# Patient Record
Sex: Female | Born: 1940 | Race: Black or African American | Hispanic: No | Marital: Married | Smoking: Former smoker
Health system: Southern US, Community
[De-identification: ages and names within clinical notes are randomized; demographics above are authoritative.]

## PROBLEM LIST (undated history)

## (undated) DIAGNOSIS — I471 Supraventricular tachycardia, unspecified: Secondary | ICD-10-CM

## (undated) DIAGNOSIS — Z87891 Personal history of nicotine dependence: Secondary | ICD-10-CM

## (undated) DIAGNOSIS — I495 Sick sinus syndrome: Secondary | ICD-10-CM

## (undated) DIAGNOSIS — G40909 Epilepsy, unspecified, not intractable, without status epilepticus: Secondary | ICD-10-CM

## (undated) HISTORY — PX: ABDOMINAL HYSTERECTOMY: SHX81

## (undated) HISTORY — DX: Supraventricular tachycardia, unspecified: I47.10

## (undated) HISTORY — DX: Sick sinus syndrome: I49.5

## (undated) HISTORY — DX: Personal history of nicotine dependence: Z87.891

## (undated) HISTORY — DX: Epilepsy, unspecified, not intractable, without status epilepticus: G40.909

## (undated) HISTORY — DX: Supraventricular tachycardia: I47.1

---

## 2000-08-12 ENCOUNTER — Inpatient Hospital Stay (HOSPITAL_COMMUNITY): Admission: EM | Admit: 2000-08-12 | Discharge: 2000-08-15 | Payer: Self-pay | Admitting: Emergency Medicine

## 2000-08-12 ENCOUNTER — Encounter: Payer: Self-pay | Admitting: Emergency Medicine

## 2000-08-12 ENCOUNTER — Encounter: Payer: Self-pay | Admitting: Internal Medicine

## 2001-04-26 ENCOUNTER — Ambulatory Visit (HOSPITAL_COMMUNITY): Admission: RE | Admit: 2001-04-26 | Discharge: 2001-04-26 | Payer: Self-pay | Admitting: Family Medicine

## 2001-04-26 ENCOUNTER — Encounter: Payer: Self-pay | Admitting: Family Medicine

## 2002-04-12 ENCOUNTER — Ambulatory Visit (HOSPITAL_COMMUNITY): Admission: RE | Admit: 2002-04-12 | Discharge: 2002-04-12 | Payer: Self-pay | Admitting: Family Medicine

## 2002-04-12 ENCOUNTER — Encounter: Payer: Self-pay | Admitting: Family Medicine

## 2003-04-24 ENCOUNTER — Other Ambulatory Visit: Admission: RE | Admit: 2003-04-24 | Discharge: 2003-04-24 | Payer: Self-pay | Admitting: General Surgery

## 2006-05-05 ENCOUNTER — Inpatient Hospital Stay (HOSPITAL_COMMUNITY): Admission: EM | Admit: 2006-05-05 | Discharge: 2006-05-06 | Payer: Self-pay | Admitting: Emergency Medicine

## 2008-06-10 ENCOUNTER — Encounter: Payer: Self-pay | Admitting: Cardiology

## 2008-06-13 ENCOUNTER — Encounter: Payer: Self-pay | Admitting: Cardiology

## 2008-10-10 ENCOUNTER — Ambulatory Visit: Payer: Self-pay | Admitting: Cardiology

## 2008-10-10 ENCOUNTER — Encounter: Payer: Self-pay | Admitting: Cardiology

## 2008-10-11 ENCOUNTER — Encounter: Payer: Self-pay | Admitting: Cardiology

## 2008-10-17 ENCOUNTER — Ambulatory Visit: Payer: Self-pay | Admitting: Cardiology

## 2008-11-07 DIAGNOSIS — E785 Hyperlipidemia, unspecified: Secondary | ICD-10-CM

## 2008-11-07 HISTORY — DX: Hyperlipidemia, unspecified: E78.5

## 2008-11-13 ENCOUNTER — Ambulatory Visit: Payer: Self-pay | Admitting: Cardiology

## 2008-11-13 DIAGNOSIS — R55 Syncope and collapse: Secondary | ICD-10-CM

## 2008-11-13 HISTORY — DX: Syncope and collapse: R55

## 2008-11-14 ENCOUNTER — Encounter: Payer: Self-pay | Admitting: Cardiology

## 2008-12-11 ENCOUNTER — Ambulatory Visit: Payer: Self-pay | Admitting: Internal Medicine

## 2008-12-20 ENCOUNTER — Encounter: Payer: Self-pay | Admitting: Internal Medicine

## 2008-12-20 ENCOUNTER — Telehealth (INDEPENDENT_AMBULATORY_CARE_PROVIDER_SITE_OTHER): Payer: Self-pay | Admitting: *Deleted

## 2008-12-27 ENCOUNTER — Ambulatory Visit (HOSPITAL_COMMUNITY): Admission: RE | Admit: 2008-12-27 | Discharge: 2008-12-27 | Payer: Self-pay | Admitting: Internal Medicine

## 2008-12-27 ENCOUNTER — Ambulatory Visit: Payer: Self-pay | Admitting: Internal Medicine

## 2008-12-31 ENCOUNTER — Encounter: Payer: Self-pay | Admitting: Internal Medicine

## 2009-01-09 ENCOUNTER — Ambulatory Visit: Payer: Self-pay

## 2009-01-29 ENCOUNTER — Ambulatory Visit (HOSPITAL_COMMUNITY): Payer: Self-pay | Admitting: Oncology

## 2009-01-29 ENCOUNTER — Encounter (HOSPITAL_COMMUNITY): Admission: RE | Admit: 2009-01-29 | Discharge: 2009-02-28 | Payer: Self-pay | Admitting: Oncology

## 2009-04-04 ENCOUNTER — Ambulatory Visit: Payer: Self-pay | Admitting: Internal Medicine

## 2009-04-04 DIAGNOSIS — R569 Unspecified convulsions: Secondary | ICD-10-CM | POA: Insufficient documentation

## 2009-04-04 HISTORY — DX: Unspecified convulsions: R56.9

## 2009-04-07 DIAGNOSIS — I495 Sick sinus syndrome: Secondary | ICD-10-CM | POA: Insufficient documentation

## 2009-04-11 ENCOUNTER — Inpatient Hospital Stay (HOSPITAL_COMMUNITY): Admission: RE | Admit: 2009-04-11 | Discharge: 2009-04-12 | Payer: Self-pay | Admitting: Internal Medicine

## 2009-04-11 ENCOUNTER — Ambulatory Visit: Payer: Self-pay | Admitting: Internal Medicine

## 2009-04-11 HISTORY — PX: PACEMAKER INSERTION: SHX728

## 2009-04-12 ENCOUNTER — Encounter: Payer: Self-pay | Admitting: Internal Medicine

## 2009-04-23 ENCOUNTER — Encounter: Payer: Self-pay | Admitting: Internal Medicine

## 2009-04-23 ENCOUNTER — Ambulatory Visit: Payer: Self-pay

## 2009-05-02 ENCOUNTER — Encounter (HOSPITAL_COMMUNITY): Admission: RE | Admit: 2009-05-02 | Discharge: 2009-06-01 | Payer: Self-pay | Admitting: Oncology

## 2009-05-02 ENCOUNTER — Ambulatory Visit (HOSPITAL_COMMUNITY): Payer: Self-pay | Admitting: Oncology

## 2009-06-09 ENCOUNTER — Encounter: Payer: Self-pay | Admitting: Internal Medicine

## 2009-08-01 ENCOUNTER — Encounter (HOSPITAL_COMMUNITY): Admission: RE | Admit: 2009-08-01 | Discharge: 2009-08-31 | Payer: Self-pay | Admitting: Oncology

## 2009-08-04 ENCOUNTER — Ambulatory Visit (HOSPITAL_COMMUNITY): Payer: Self-pay | Admitting: Oncology

## 2009-08-08 ENCOUNTER — Ambulatory Visit: Payer: Self-pay | Admitting: Internal Medicine

## 2009-08-08 DIAGNOSIS — I471 Supraventricular tachycardia: Secondary | ICD-10-CM | POA: Insufficient documentation

## 2009-10-03 ENCOUNTER — Encounter: Payer: Self-pay | Admitting: Internal Medicine

## 2010-02-02 ENCOUNTER — Encounter (HOSPITAL_COMMUNITY): Admission: RE | Admit: 2010-02-02 | Payer: Self-pay | Admitting: Oncology

## 2010-02-24 ENCOUNTER — Ambulatory Visit: Payer: Self-pay | Admitting: Internal Medicine

## 2010-02-25 ENCOUNTER — Encounter: Payer: Self-pay | Admitting: Internal Medicine

## 2010-04-04 ENCOUNTER — Encounter: Payer: Self-pay | Admitting: Family Medicine

## 2010-04-12 LAB — CONVERTED CEMR LAB
BUN: 13 mg/dL (ref 6–23)
Eosinophils Absolute: 0.2 10*3/uL (ref 0.0–0.7)
GFR calc non Af Amer: 106.19 mL/min (ref >60)
Glucose, Bld: 80 mg/dL (ref 70–99)
INR: 0.9 (ref 0.8–1.0)
Lymphs Abs: 1.5 10*3/uL (ref 0.7–4.0)
Neutro Abs: 1.2 10*3/uL — ABNORMAL LOW (ref 1.4–7.7)
Neutrophils Relative %: 39.5 % — ABNORMAL LOW (ref 43.0–77.0)
Potassium: 4.2 meq/L (ref 3.5–5.1)
Prothrombin Time: 9.8 s (ref 9.1–11.7)
Sodium: 140 meq/L (ref 135–145)
aPTT: 26 s (ref 21.7–28.8)

## 2010-04-14 NOTE — Cardiovascular Report (Signed)
Summary: Pre Op Orders  Pre Op Orders   Imported By: Roderic Ovens 04/10/2009 11:15:44  _____________________________________________________________________  External Attachment:    Type:   Image     Comment:   External Document

## 2010-04-14 NOTE — Cardiovascular Report (Signed)
Summary: [facility]/ [facility] SUMMARY  [facility]/ [facility] SUMMARY   Imported By: Dorise Hiss 08/14/2009 11:08:21  _____________________________________________________________________  External Attachment:    Type:   Image     Comment:   External Document

## 2010-04-14 NOTE — Letter (Signed)
Summary: [facility] Office Note  [facility] Office Note   Imported By: Roderic Ovens 07/30/2009 09:54:16  _____________________________________________________________________  External Attachment:    Type:   Image     Comment:   External Document

## 2010-04-14 NOTE — Assessment & Plan Note (Signed)
Summary: pc2   Visit Type:  Pacemaker check Referring Provider:  Dr Diona Browner Primary Provider:  Dr. Mirna Mires   History of Present Illness: The patient presents today for routine electrophysiology followup. She reports doing very well since last being seen in our clinic. The patient denies symptoms of palpitations, chest pain, shortness of breath, orthopnea, PND, lower extremity edema, dizziness, presyncope, syncope, or neurologic sequela. The patient is tolerating medications without difficulties and is otherwise without complaint today.   Preventive Screening-Counseling & Management  Alcohol-Tobacco     Smoking Status: quit     Year Quit: 1990  Current Medications (verified): 1)  Tegretol Xr 200 Mg Xr12h-Tab (Carbamazepine) .... 2 Tabs Two Times A Day 2)  Keppra 500 Mg Tabs (Levetiracetam) .... 2 Tabs Two Times A Day  Allergies (verified): No Known Drug Allergies  Comments:  Nurse/Medical Assistant: The patient's medications and allergies were reviewed with the patient and were updated in the Medication and Allergy Lists. Bottles reviewed.  Past History:  Past Medical History: Seizure disorder Hyperlipidemia recurrent syncope and symptomatic bradycardia  Past Surgical History: Hysterectomy s/p PPM implant  Social History: Smoking Status:  quit  Vital Signs:  Patient profile:   70 year old female Height:      60 inches Weight:      102 pounds Pulse rate:   78 / minute BP sitting:   104 / 69  (left arm) Cuff size:   regular  Vitals Entered By: Carlye Grippe (Aug 08, 2009 1:28 PM)  Physical Exam  General:  Well developed, well nourished, in no acute distress. Head:  normocephalic and atraumatic Eyes:  PERRLA/EOM intact; conjunctiva and lids normal. Mouth:  Teeth, gums and palate normal. Oral mucosa normal. Neck:  Neck supple, no JVD. No masses, thyromegaly or abnormal cervical nodes. Chest Wall:  pacemaker site is well healed Lungs:  Clear bilaterally  to auscultation and percussion. Heart:  Non-displaced PMI, chest non-tender; regular rate and rhythm, S1, S2 without murmurs, rubs or gallops. Carotid upstroke normal, no bruit. Normal abdominal aortic size, no bruits. Femorals normal pulses, no bruits. Pedals normal pulses. No edema, no varicosities. Abdomen:  Bowel sounds positive; abdomen soft and non-tender without masses, organomegaly, or hernias noted. No hepatosplenomegaly. Msk:  Back normal, normal gait. Muscle strength and tone normal. Pulses:  pulses normal in all 4 extremities Extremities:  No clubbing or cyanosis. Neurologic:  Alert and oriented x 3.   PPM Specifications Following MD:  Hillis Range, MD     PPM Vendor:  St Jude     PPM Model Number:  [id number]     PPM Serial Number:  [id number] DOI:  04/11/2009     PPM Implanting MD:  Hillis Range, MD  Lead 1    Location: RA     DOI: 04/11/2009     Model #: 1888TC     Serial #: [id number]     Status: active Lead 2    Location: RV     DOI: 04/11/2009     Model #: 1948     Serial #: [id number]     Status: active  Magnet Response Rate:  BOL 100 ERI  85  Indications:  Huston Foley; Syncope   PPM Follow Up Remote Check?  No Battery Voltage:  2.99 V     Battery Est. Longevity:  11.9 YEARS     Pacer Dependent:  No       PPM Device Measurements Atrium  Amplitude: 4.1 mV, Impedance: 400 ohms, Threshold:  0.75 V at 0.4 msec Right Ventricle  Amplitude: 10.8 mV, Impedance: 650 ohms, Threshold: 0.75 V at 0.4 msec  Episodes MS Episodes:  14     Percent Mode Switch:  <1%     Coumadin:  No Ventricular High Rate:  3     Atrial Pacing:  <1%     Ventricular Pacing:  <1%  Parameters Mode:  DDD     Lower Rate Limit:  60     Upper Rate Limit:  120 Paced AV Delay:  200     Sensed AV Delay:  200 Next Cardiology Appt Due:  03/16/2010 Tech Comments:  Normal device function.  RA and RV outputs decreased to chronic levels today.  Ventricular autocapture not turned on because of infrequent ventricular  pacing.  High rate episodes are 1:1 atrial tachycardia.  No other changes made.  ROV 1-12 JA. 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

## 2010-04-14 NOTE — Letter (Signed)
Summary: Implantable Device Instructions  Architectural technologist, Main Office  [address]   [place], [place] [address]   Phone: [phone]  Fax: [phone]      Implantable Device Instructions  You are scheduled for:  _____ Permanent Transvenous Pacemaker   on 04/11/09 with Dr. Johney Frame  1.  Please arrive at the Short Stay Center at [facility] at 6:00am on the day of your procedure.  2.  Do not eat or drink after midnight  the night before your procedure.  3.  Complete lab work on 04/04/09  The lab at [facility] is open from 8:30 AM to 1:30 PM and from 2:30 PM to 5:00 PM.  The lab at [facility] is open from 7:30 AM to 5:30 PM.  You do not have to be fasting.  4.  Plan for an overnight stay.  Bring your insurance cards and a list of your medications.  5.  Wash your chest and neck with antibacterial soap (any brand) the evening before and the morning of your procedure.  Rinse well.  7.  Education material received:     Pacemaker _____            *If you have ANY questions after you get home, please call the office [phone]. Anselm Pancoast  *Every attempt is made to prevent procedures from being rescheduled.  Due to the nauture of Electrophysiology, rescheduling can happen.  The physician is always aware and directs the staff when this occurs.

## 2010-04-14 NOTE — Cardiovascular Report (Signed)
Summary: Office Visit   Office Visit   Imported By: Roderic Ovens 05/06/2009 11:58:45  _____________________________________________________________________  External Attachment:    Type:   Image     Comment:   External Document

## 2010-04-14 NOTE — Assessment & Plan Note (Signed)
Summary: pc2 medtronic check./sl   Visit Type:  Follow-up Referring Provider:  Dr Diona Browner Primary Provider:  Dr. Mirna Mires   History of Present Illness: The patient presents today for routine electrophysiology followup. She reports doing very well since last being seen in our clinic.  She denies any further episodes of syncope, but does note occasional dizziness. The patient denies symptoms of palpitations, chest pain, shortness of breath, orthopnea, PND, lower extremity edema,  or neurologic sequela. The patient is tolerating medications without difficulties and is otherwise without complaint today.   Current Medications (verified): 1)  Tegretol Xr 200 Mg Xr12h-Tab (Carbamazepine) .... 2 Tabs Two Times A Day 2)  Keppra 500 Mg Tabs (Levetiracetam) .... 2 Tabs Two Times A Day  Allergies (verified): No Known Drug Allergies  Past History:  Past Medical History: Seizure disorder Hyperlipidemia recurrent syncope  Past Surgical History: Reviewed history from 11/13/2008 and no changes required. Hysterectomy  Family History: Reviewed history from 12/11/2008 and no changes required. Siblings: sister died age 54 with CAD and ministrokes.  She has 3 siblings that died with cirrhosis of the liver (ETOH related). She denies FH of arrhythmias, seizures, or sudden death.  Social History: Reviewed history from 12/11/2008 and no changes required. Tobacco Use - Quit 1970s Alcohol Use - no Drug Use - no Married. Lives in [place] with husband. Retired from Designer, fashion/clothing.  Review of Systems       All systems are reviewed and negative except as listed in the HPI.   Vital Signs:  Patient profile:   70 year old female Height:      60 inches Weight:      106 pounds BMI:     20.78 Pulse rate:   65 / minute BP sitting:   128 / 72  Vitals Entered By: Laurance Flatten CMA (April 04, 2009 12:05 PM)  Physical Exam  General:  Well developed, well nourished, in no acute distress. Head:   normocephalic and atraumatic Eyes:  PERRLA/EOM intact; conjunctiva and lids normal. Nose:  no deformity, discharge, inflammation, or lesions Mouth:  Teeth, gums and palate normal. Oral mucosa normal. Neck:  Neck supple, no JVD. No masses, thyromegaly or abnormal cervical nodes. Chest Wall:  reveal site is well healed Lungs:  Clear bilaterally to auscultation and percussion. Heart:  Non-displaced PMI, chest non-tender; regular rate and rhythm, S1, S2 without murmurs, rubs or gallops. Carotid upstroke normal, no bruit. Normal abdominal aortic size, no bruits. Femorals normal pulses, no bruits. Pedals normal pulses. No edema, no varicosities. Abdomen:  Bowel sounds positive; abdomen soft and non-tender without masses, organomegaly, or hernias noted. No hepatosplenomegaly. Msk:  Back normal, normal gait. Muscle strength and tone normal. Pulses:  pulses normal in all 4 extremities Extremities:  No clubbing or cyanosis. Neurologic:  Alert and oriented x 3. Skin:  Intact without lesions or rashes. Cervical Nodes:  no significant adenopathy Psych:  Normal affect.  Nuclear Study  Procedure date:  10/17/2008  Findings:         1. This study was performed using the Standard Bruce exercise         protocol. The patient exercised into stage 2, reaching135 bpm, 90%         MPHR. Maximum METs of 7 were achieved. No chest pain reported. Lead         motion artifact.  No clearly diagnostic ST changes with a hypertensive         response to stress.  2. Probably normal LV perfusion. Limitations and artifact were due to         breast tissue. No signficant stress defects. The patient's calculated         post stress LVEF was 63%. TID Ratio 1.14.  Echocardiogram  Procedure date:  10/11/2008  Findings:      1. The left ventricular chamber size is normal. There is no left         ventricular hypertrophy.   Global left ventricular wall motion and         contractility are within normal limits.  The estimated ejection         fraction is 55-60%.  Abnormal left ventricular diastolic filling is         observed, consistent with impaired relaxation.          2. There is mild mitral regurgitation.          3. The left atrial chamber size is normal - extrinsically compressed         by aorta.         4. There is a trace of aortic regurgitation. The mean gradient of the         aortic valve is 3 mmHg.          5. The right ventricular global systolic function is normal. There is         prominent trabeculation noted.         6. There is mild to moderate tricuspid regurgitation. The right         ventricular systolic pressure is calculated at 42 mmHg.          7. There is no pericardial effusion.  CXR  Procedure date:  10/10/2008  Findings:      Normal heart size and vascularity.  Minimal basilar         atelectasis bilaterally.  Negative for edema, pneumonia, effusion         or pneumothorax.  Midline trachea.  Degenerative thoracic spine.         Telemetry leads overlie the chest.                   IMPRESSION:         Basilar atelectasis.     Signed by Hardin Negus, RMA on 11/07/2008 at 9:55 AM  ________________________________________________________________________     Carotid Doppler  Procedure date:  08/12/2000  Findings:       IMPRESSION:  1.  MILD PLAQUE AT BOTH CAROTID BIFURCATIONS   BUT NO EVIDENCE FOR FLOW LIMITING STENOSIS.   2. NORMAL ANTEGRADE FLOW IN BOTH VERTEBRAL ARTERIES.      EKG  Procedure date:  04/04/2009  Findings:      sinus rhythm 65 bpm, TWI (nonspecific)   ILR Following MD Hillis Range, MD DOI:  12/27/2008 Vendor:  Medtronic     Model Number:  8119     Serial Number [id number]       Tachy Episodes:  10      Tech Comments:  Tachy episodes with rates in the 170's.  11 asystole episodes the longest 20 seconds. Altha Harm, LPN  April 04, 2009 12:34 PM   MD Comments:  tachy episodes appear to be a long RP tachycardia.   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  She carries a diagnosis of seizures, though I am not certain that these have been well documented.  Given our concerns for an arrhythmic cause of syncope, we recently placed an implantable loop recorded.  This has not documented marked sinus node dysfunction, with very pronounced sinus pauses (mutiple pauses > 5 seconds).  I have therefore recommended pacemaker implantation.  Risks, benefits, alternatives to pacemaker implantation were discussed in detail with the patient today.  He understands that the risks include but are not limited to bleeding, infection, pneumothorax, perforation, tamponade, vascular damage, renal failure, MI, stroke, death,  and lead dislodgement.  She accepts these risks and wishes to proceed.  We will plan for pacemaker implant at the next available time.  In the interim, she should continue to refrain from driving.  Problem # 2:  SEIZURE DISORDER (ICD-780.39) The patient carries a diagnosis of seizure disorder, though I am uncertain as to how well this is documented.  She has been followed by [facility] but requests referral to a Neurologist in [place].  I will refer her to Dr Sandria Manly.  His assistance will be greatly appreciated as we differentiate whether her syncope has been due to sinus node dysfunction and pauses vs seizures.  Her updated medication list for this problem includes:    Tegretol Xr 200 Mg Xr12h-tab (Carbamazepine) .Marland Kitchen... 2 tabs two times a day    Keppra 500 Mg Tabs (Levetiracetam) .Marland Kitchen... 2 tabs two times a day  Orders: Misc. Referral (Misc. 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

## 2010-04-14 NOTE — Procedures (Signed)
Summary: wound check   Problems Prior to Update: 1)  Sinoatrial Node Dysfunction  (ICD-427.81) 2)  Seizure Disorder  (ICD-780.39) 3)  Syncope  (ICD-780.2) 4)  Hyperlipidemia-mixed  (ICD-272.4)  Medications Prior to Update: 1)  Tegretol Xr 200 Mg Xr12h-Tab (Carbamazepine) .... 2 Tabs Two Times A Day 2)  Keppra 500 Mg Tabs (Levetiracetam) .... 2 Tabs Two Times A Day  Current Medications (verified): 1)  Tegretol Xr 200 Mg Xr12h-Tab (Carbamazepine) .... 2 Tabs Two Times A Day 2)  Keppra 500 Mg Tabs (Levetiracetam) .... 2 Tabs Two Times A Day  Allergies (verified): No Known Drug Allergies  PPM Specifications Following MD:  Hillis Range, MD     PPM Vendor:  St Jude     PPM Model Number:  [id number]     PPM Serial Number:  [id number] DOI:  04/11/2009     PPM Implanting MD:  Hillis Range, MD  Lead 1    Location: RA     DOI: 04/11/2009     Model #: 1888TC     Serial #: [id number]     Status: active Lead 2    Location: RV     DOI: 04/11/2009     Model #: 1948     Serial #: [id number]     Status: active  Magnet Response Rate:  BOL 100 ERI  85  Indications:  Huston Foley; Syncope   PPM Follow Up Remote Check?  No Battery Voltage:  3.11 V     Battery Est. Longevity:  12.2 YEARS     Pacer Dependent:  No       PPM Device Measurements Atrium  Amplitude: 3.2 mV, Impedance: 430 ohms, Threshold: 0.5 V at 0.5 msec Right Ventricle  Amplitude: 12 mV, Impedance: 660 ohms, Threshold: 0.75 V at 0.5 msec  Episodes MS Episodes:  0     Ventricular High Rate:  0     Atrial Pacing:  <1%     Ventricular Pacing:  <1%  Parameters Mode:  DDD     Lower Rate Limit:  60     Upper Rate Limit:  120 Paced AV Delay:  200     Sensed AV Delay:  200 Next Cardiology Appt Due:  07/14/2009 Tech Comments:  Wound check appt today.  Steri-strips removed from pacer and ILR site.  Both wounds without redness or edema.  Normal device function.  No changes made today.  Wound care instructions done.  ROV 5-11 [employer] office. Gypsy Balsam RN BSN  April 23, 2009 10:01 AM   ILR Following MD Hillis Range, MD DOI:  12/27/2008 Vendor:  Medtronic     Model Number:  9528     Serial Number [id number]

## 2010-04-14 NOTE — Letter (Signed)
Summary: [facility] Office Visit Note   [facility] Office Visit Note   Imported By: Roderic Ovens 10/20/2009 15:54:46  _____________________________________________________________________  External Attachment:    Type:   Image     Comment:   External Document

## 2010-04-14 NOTE — Miscellaneous (Signed)
Summary: Device upgrade  Clinical Lists Changes  Observations: Added new observation of PPM INDICATN: Brady; Syncope (04/11/2009 11:23) Added new observation of MAGNET RTE: BOL 100 ERI  85 (04/11/2009 11:23) Added new observation of PPMLEADSTAT2: active (04/11/2009 11:23) Added new observation of PPMLEADSER2: [id number] (04/11/2009 11:23) Added new observation of PPMLEADMOD2: 1948  (04/11/2009 11:23) Added new observation of PPMLEADLOC2: RV  (04/11/2009 11:23) Added new observation of PPMLEADSTAT1: active  (04/11/2009 11:23) Added new observation of PPMLEADSER1: [id number]  (04/11/2009 11:23) Added new observation of PPMLEADMOD1: 1888TC  (04/11/2009 11:23) Added new observation of PPMLEADLOC1: RA  (04/11/2009 11:23) Added new observation of PPM IMP MD: Hillis Range, MD  (04/11/2009 11:23) Added new observation of PPMLEADDOI2: 04/11/2009  (04/11/2009 11:23) Added new observation of PPMLEADDOI1: 04/11/2009  (04/11/2009 11:23) Added new observation of PPM DOI: 04/11/2009  (04/11/2009 11:23) Added new observation of PPM SERL#: [id number]  (04/11/2009 11:23) Added new observation of PPM MODL#: [id number]  (04/11/2009 78:46) Added new observation of PACEMAKERMFG: St Jude  (04/11/2009 11:23) Added new observation of PACEMAKER MD: Hillis Range, MD  (04/11/2009 11:23) Added new observation of ILR TECHCOMM: 04/11/2009 Device explanted  (04/11/2009 11:23)      PPM Specifications Following MD:  Hillis Range, MD     PPM Vendor:  St Jude     PPM Model Number:  [id number]     PPM Serial Number:  [id number] PPM DOI:  04/11/2009     PPM Implanting MD:  Hillis Range, MD  Lead 1    Location: RA     DOI: 04/11/2009     Model #: 1888TC     Serial #: [id number]     Status: active Lead 2    Location: RV     DOI: 04/11/2009     Model #: 1948     Serial #: [id number]     Status: active  Magnet Response Rate:  BOL 100 ERI  85  Indications:  Huston Foley; Syncope   ILR Following MD Hillis Range, MD DOI:  12/27/2008 Vendor:   Medtronic     Model Number:  9528     Serial Number [id number]        Tech Comments:  04/11/2009 Device explanted

## 2010-04-16 NOTE — Letter (Signed)
Summary: Appointment -missed  [facility] [address]   [place], [place] [address]   Phone: [phone]  Fax: [phone]     February 24, 2010 MRN: [id number]     [facility] [address] [place], [place]  [address]     Dear Ms. Rhonda Hill,  Our records indicate you missed your appointment on February 24, 2010                        with Dr. Johney Frame.   It is very important that we reach you to reschedule this appointment. We look forward to participating in your health care needs.   Please contact us at the number listed above at your earliest convenience to reschedule this appointment.   Sincerely,    Glass blower/designer

## 2010-04-30 ENCOUNTER — Encounter (INDEPENDENT_AMBULATORY_CARE_PROVIDER_SITE_OTHER): Payer: MEDICARE | Admitting: Internal Medicine

## 2010-04-30 ENCOUNTER — Encounter: Payer: Self-pay | Admitting: Internal Medicine

## 2010-04-30 DIAGNOSIS — I495 Sick sinus syndrome: Secondary | ICD-10-CM

## 2010-05-06 NOTE — Assessment & Plan Note (Signed)
Summary: rov-r/s from no show...per lela she was told by Tresa Endo to add   Visit Type:  Pacemaker check Referring Provider:  Dr Diona Browner Primary Provider:  Dr. Mirna Mires   History of Present Illness: The patient presents today for routine electrophysiology followup. She reports doing very well since last being seen in our clinic. The patient denies symptoms of palpitations, chest pain, shortness of breath, orthopnea, PND, lower extremity edema, dizziness, presyncope, syncope, or neurologic sequela. The patient is tolerating medications without difficulties and is otherwise without complaint today.   Preventive Screening-Counseling & Management  Alcohol-Tobacco     Smoking Status: quit     Year Quit: 1990  Current Medications (verified): 1)  Tegretol Xr 200 Mg Xr12h-Tab (Carbamazepine) .... 2 Tabs Two Times A Day 2)  Keppra 500 Mg Tabs (Levetiracetam) .... 2 Tabs Two Times A Day 3)  Osteosheath 4 .... Take 1 Tablet By Mouth Once A Day  Allergies (verified): No Known Drug Allergies  Comments:  Nurse/Medical Assistant: The patient's medications and allergies were reviewed with the patient and were updated in the Medication and Allergy Lists. Tammi Romine CMA (April 30, 2010 2:49 PM)  Past History:  Past Medical History: Reviewed history from 08/08/2009 and no changes required. Seizure disorder Hyperlipidemia recurrent syncope and symptomatic bradycardia  Past Surgical History: Hysterectomy s/p PPM implant (SJM) by Fawn Kirk  Social History: Reviewed history from 12/11/2008 and no changes required. Tobacco Use - Quit 1970s Alcohol Use - no Drug Use - no Married. Lives in [place] with husband. Retired from Designer, fashion/clothing.  Vital Signs:  Patient profile:   70 year old female Height:      60 inches Weight:      101 pounds BMI:     19.80 O2 Sat:      96 % on Room air Pulse rate:   74 / minute BP sitting:   138 / 82  (left arm) Cuff size:   regular  Vitals Entered  By: Fuller Plan CMA (April 30, 2010 2:49 PM)  O2 Flow:  Room air  Physical Exam  General:  thin, NAD, very pleasant Head:  normocephalic and atraumatic Eyes:  PERRLA/EOM intact; conjunctiva and lids normal. Mouth:  Teeth, gums and palate normal. Oral mucosa normal. Neck:  supple Chest Wall:  pacemaker site is well healed Lungs:  Clear bilaterally to auscultation and percussion. Heart:  Non-displaced PMI, chest non-tender; regular rate and rhythm, S1, S2 without murmurs, rubs or gallops. Carotid upstroke normal, no bruit. Normal abdominal aortic size, no bruits. Femorals normal pulses, no bruits. Pedals normal pulses. No edema, no varicosities. Abdomen:  Bowel sounds positive; abdomen soft and non-tender without masses, organomegaly, or hernias noted. No hepatosplenomegaly. Msk:  Back normal, normal gait. Muscle strength and tone normal. Extremities:  No clubbing or cyanosis. Neurologic:  Alert and oriented x 3. Skin:  Intact without lesions or rashes. Psych:  Normal affect.   PPM Specifications Following MD:  Hillis Range, MD     PPM Vendor:  [employer] Jude     PPM Model Number:  [id number]     PPM Serial Number:  [id number] PPM DOI:  04/11/2009     PPM Implanting MD:  Hillis Range, MD  Lead 1    Location: RA     DOI: 04/11/2009     Model #: [id number]     Serial #: [id number]     Status: active Lead 2    Location: RV     DOI: 04/11/2009  Model #: [id number]     Serial #: [id number]     Status: active  Magnet Response Rate:  BOL 100 ERI  85  Indications:  Huston Foley; Syncope   PPM Follow Up Pacer Dependent:  No      Episodes Coumadin:  No  Parameters Mode:  DDD     Lower Rate Limit:  60     Upper Rate Limit:  120 Paced AV Delay:  200     Sensed AV Delay:  200 MD Comments:  see scanned report  ILR Following MD Hillis Range, MD DOI:  12/27/2008 Vendor:  [employer]onic     Model Number:  [id number]     Serial Number [id number]        Impression & Recommendations:  Problem # 1:  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 2 tabs two times a day    Keppra 500 Mg Tabs (Levetiracetam) .Marland Kitchen... 2 tabs two times a day  Problem # 3:  PAT (ICD-427.0) no further episodes no changes  BP slightly elevated today salt restriction advised we could consider low dose beta blocker if BP remains elevated.  Patient Instructions: 1)  Merlin checks every 3 months 2)  return in 12 months

## 2010-05-21 NOTE — Cardiovascular Report (Signed)
Summary: [facility]/  [facility]  Card Device [facility]/  [facility]   Imported By: Dorise Hiss 05/13/2010 16:52:49  _____________________________________________________________________  External Attachment:    Type:   Image     Comment:   External Document

## 2010-06-01 LAB — DIFFERENTIAL
Basophils Absolute: 0 10*3/uL (ref 0.0–0.1)
Eosinophils Relative: 5 % (ref 0–5)
Lymphocytes Relative: 45 % (ref 12–46)
Lymphs Abs: 1.6 10*3/uL (ref 0.7–4.0)
Monocytes Absolute: 0.2 10*3/uL (ref 0.1–1.0)
Monocytes Relative: 6 % (ref 3–12)
Neutro Abs: 1.5 10*3/uL — ABNORMAL LOW (ref 1.7–7.7)
Neutrophils Relative %: 43 % (ref 43–77)

## 2010-06-01 LAB — CBC
Hemoglobin: 12 g/dL (ref 12.0–15.0)
Platelets: 303 10*3/uL (ref 150–400)
RBC: 3.78 MIL/uL — ABNORMAL LOW (ref 3.87–5.11)

## 2010-06-03 LAB — DIFFERENTIAL
Basophils Relative: 1 % (ref 0–1)
Eosinophils Absolute: 0.2 10*3/uL (ref 0.0–0.7)
Neutro Abs: 1.3 10*3/uL — ABNORMAL LOW (ref 1.7–7.7)
Neutrophils Relative %: 40 % — ABNORMAL LOW (ref 43–77)

## 2010-06-03 LAB — CBC
HCT: 36.9 % (ref 36.0–46.0)
Hemoglobin: 12.7 g/dL (ref 12.0–15.0)
MCHC: 34.3 g/dL (ref 30.0–36.0)
MCV: 91.8 fL (ref 78.0–100.0)
Platelets: 264 10*3/uL (ref 150–400)
RBC: 4.02 MIL/uL (ref 3.87–5.11)

## 2010-06-17 LAB — CBC
Hemoglobin: 13 g/dL (ref 12.0–15.0)
MCV: 91.9 fL (ref 78.0–100.0)
Platelets: 262 10*3/uL (ref 150–400)
RDW: 12.9 % (ref 11.5–15.5)
WBC: 2.6 10*3/uL — ABNORMAL LOW (ref 4.0–10.5)

## 2010-06-17 LAB — DIFFERENTIAL
Eosinophils Relative: 5 % (ref 0–5)
Lymphocytes Relative: 57 % — ABNORMAL HIGH (ref 12–46)
Lymphs Abs: 1.5 10*3/uL (ref 0.7–4.0)
Monocytes Absolute: 0.2 10*3/uL (ref 0.1–1.0)
Monocytes Relative: 8 % (ref 3–12)
Neutro Abs: 0.8 10*3/uL — ABNORMAL LOW (ref 1.7–7.7)

## 2010-07-29 ENCOUNTER — Ambulatory Visit (INDEPENDENT_AMBULATORY_CARE_PROVIDER_SITE_OTHER): Payer: Medicare Other | Admitting: *Deleted

## 2010-07-29 DIAGNOSIS — I495 Sick sinus syndrome: Secondary | ICD-10-CM

## 2010-07-29 DIAGNOSIS — R55 Syncope and collapse: Secondary | ICD-10-CM

## 2010-07-30 ENCOUNTER — Encounter: Payer: MEDICARE | Admitting: *Deleted

## 2010-07-31 ENCOUNTER — Other Ambulatory Visit (HOSPITAL_COMMUNITY): Payer: Self-pay

## 2010-07-31 ENCOUNTER — Other Ambulatory Visit: Payer: Self-pay | Admitting: Internal Medicine

## 2010-07-31 NOTE — Discharge Summary (Signed)
NAMEJOSSIE, SMOOT            ACCOUNT NO.:  [id number]   MEDICAL RECORD NO.:  [id number]          PATIENT TYPE:  INP   LOCATION:  4739                         FACILITY:  [facility]   PHYSICIAN:  Lonia Blood, M.D.       DATE OF BIRTH:  Oct 04, 1940   DATE OF ADMISSION:  05/05/2006  DATE OF DISCHARGE:  05/06/2006                               DISCHARGE SUMMARY   PRIMARY CARE PHYSICIAN:  Dr. Mirna Mires.   DISCHARGE DIAGNOSES:  1. Presyncope, no recurrence.  2. Mild orthostatic hypotension.  3. Epilepsy.  4. Unspecified dizziness.   DISCHARGE MEDICATIONS:  1. Tegretol 400 mg three times a day.  2. Meclizine 12.5 mg every 6 hours as needed for dizziness.  3. Aspirin 81 mg daily.  4. Zocor 20 mg at bedtime as before.   CONDITION ON DISCHARGE:  Mrs. Hartzog was discharged in good condition.  At the time of discharge, the patient was alert and oriented without any  discomfort.  The patient was instructed to follow up with her primary  care physician, Dr. Loleta Chance, and we have also set up the patient to follow  up with Dr. Nash Shearer from Neurology on June 03, 2006 at 11 a.m.   PROCEDURES DURING THIS ADMISSION:  1. On May 06, 2006, the patient underwent an MRI of the head with      findings of no acute intracranial findings.  2. Electroencephalogram per Dr. Nash Shearer.   CONSULTATION:  The patient was seen in consultation by Dr. Nash Shearer from  Neurology.   HISTORY AND PHYSICAL:  For admission history and physical, refer to the  dictated H&P done by Dr. Luciana Axe, May 05, 2006.   HOSPITAL COURSE:  PROBLEM #1 - QUESTIONABLE PRESYNCOPE/DIZZINESS/NOT  FEELING TOO GOOD:  Mrs. Loch presented to her primary care physician,  Dr. Loleta Chance, on May 05, 2006 with complaints of subacute dizziness.  Since there was a high-risk that the patient may have had a stroke, she  was promptly referred to the emergency room for admission.  We have  placed Mrs. Ketcher on observation at [facility]  and she  underwent prompt neurological evaluation.  It was felt that the patient  did not suffer a stroke and she did not have an acute vertigo.  The MRI  of the brain confirmed the above findings.  We have adjusted and  consolidated the patient's  Tegretol doses and she will continue to follow up with Dr. Loleta Chance and she  will follow up with Dr. Nash Shearer in [facility]  office.  The patient was found to be borderline orthostatic and she was  advised to eat a high-salt diet and drink plenty of fluids.      Lonia Blood, M.D.  Electronically Signed     SL/MEDQ  D:  05/20/2006  T:  05/21/2006  Job:  [id number]   cc:   Annia Friendly. Loleta Chance, MD  Bevelyn Buckles. Nash Shearer, M.D.

## 2010-07-31 NOTE — H&P (Signed)
NAMEEDDITH, MENTOR NO.:  [id number]   MEDICAL RECORD NO.:  [id number]          PATIENT TYPE:  INP   LOCATION:  4739                         FACILITY:  [facility]   PHYSICIAN:  Gardiner Barefoot, MD    DATE OF BIRTH:  Jul 17, 1940   DATE OF ADMISSION:  05/05/2006  DATE OF DISCHARGE:                              HISTORY & PHYSICAL   PRIMARY CARE PHYSICIAN:  Mirna Mires, MD.   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  The patient  reports that over the last couple of days, 2-3 days, she has been having  difficulty maintaining her posture due to dizziness and vertigo and had  difficulty walking.  When she presented to her primary care physician,  she was found to be presyncopal and was sent to the emergency room for  evaluation.  Here in the emergency room, the patient had a syncopal  event while getting up on the bed, but with no head trauma or injuries  of any kind.  The patient, otherwise, has no recent illness.  No nausea,  vomiting, fever, nightsweats, or any other precipitating factors.  The  patient continues to take her Tegretol and last seen by her neurologist  who is in [place].  However, he since has left the practice and she has  not followed up with a new neurologist since last year at least.   PAST MEDICAL HISTORY:  Hyperlipidemia and seizure disorder.   MEDICATIONS:  1. Tegretol 240 mg twice a day.  2. Carbamazepine is 200 mg three times a day.  3. Aspirin 81 mg daily, and Zocor daily.   ALLERGIES:  No known drug allergies.   SOCIAL HISTORY:  Denies any smoking, drinking, or alcohol.   FAMILY HISTORY:  No seizure disorder.   REVIEW OF SYSTEMS:  Complete 12-point review of system was obtained and  was negative other than that is presented in the history of present  illness.   PHYSICAL  EXAMINATION:  Temperature is 97.1, pulse of 64, blood pressure  is 130/73, respirations 20, O2 sat is 99% on room air.  GENERAL:  The patient is awake, alert, and oriented x3, and appears in  no acute distress.  HEENT:  Anicteric, no thrush, no lymphadenopathy.  CARDIOVASCULAR:  Regular rate and rhythm.  No murmurs, rubs, or gallops.  LUNGS:  Clear to auscultation bilaterally.  ABDOMEN:  Soft, nontender, nondistended.  Positive bowel sounds.  No  hepatosplenomegaly.  EXTREMITIES:  No cyanosis, clubbing, or edema.  SKIN:  No rash.   LABORATORY DATA:  Tegretol/carbamazepine level is 9.9.  Head noncontrast  CT is negative for acute disease.  Sodium is 134, potassium 3.8,  chloride 103, BUN 11, glucose 85, bicarb 27, hematocrit 39.   ASSESSMENT AND PLAN:  1. Vertigo and syncopal episode.  This may be secondary to her      Tegretol as that is a known side effect of Tegretol.  However,      although the  patient has no chest pain or shortness of breath or      any other symptoms, will follow her cardiac markers and keep her on      telemetry unit.  Have her seen by a neurologist to see if change in      therapy is indicated.  2. Hyperlipidemia.  Will continue with the Zocor; however, at this      point I cannot confirm the dose, so we will start her on the 20 mg      dose.  Also continue with her aspirin.  3. Dispo:  Full code.      Gardiner Barefoot, MD  Electronically Signed     RWC/MEDQ  D:  05/05/2006  T:  05/06/2006  Job:  [id number]

## 2010-07-31 NOTE — Procedures (Signed)
ELECTROENCEPHALOGRAM NUMBER:  Is [id number].   TECHNICAL DESCRIPTION:  This EEG was recorded during the awake state.  The background activity shows 9-14 Hz rhythms with higher amplitudes  seen in the posterior head regions bilaterally.  Photic stimulation was  performed which did produce a minimal driving response in the occipital  head regions.  There was no evidence of any stage II sleep present.  Hyperventilation testing produced no significant abnormalities.   IMPRESSION:  This is a normal electroencephalogram during the awake  state.           ______________________________  Genene Churn. Sandria Manly, M.D.     ZOX:WRUE  D:  05/06/2006 13:29:33  T:  05/06/2006 14:58:43  Job #:  [id number]

## 2010-07-31 NOTE — Consult Note (Signed)
Rhonda, Hill            ACCOUNT NO.:  [id number]   MEDICAL RECORD NO.:  [id number]          PATIENT TYPE:  INP   LOCATION:  1830                         FACILITY:  [facility]   PHYSICIAN:  Bevelyn Buckles. Champey, M.D.DATE OF BIRTH:  04/14/1940   DATE OF CONSULTATION:  DATE OF DISCHARGE:                                 CONSULTATION   REASON FOR CONSULTATION:  Syncope.   HISTORY OF PRESENT ILLNESS:  Rhonda Hill is a 70 year old African  American female with past medical history of seizures and high  cholesterol who presents to the ED with 3-week history of intermittent  lightheadedness and fainting feeling.  The patient actually had a  syncopal episode today.  She states her lightheadedness usually lasts  less than 10 minutes, and she does not know of any triggers, stating  they occur spontaneously.  She also does not know of any relieving or  exacerbating factors.  She does get a slight bifrontal headache with her  lightheadedness which is thriving 5/10 in intensity.  She denies any  focal weakness, vertigo, shortness of breath, palpitations, diaphoresis,  nausea and numbness, tingling, vision changes, speech changes,  swallowing problems, chewing problems or falls.  The patient states that  these events are different from her typical seizures where she usually  stares off and is unresponsive with loss of consciousness for a few  minutes and then back to normal.  She was diagnosed with seizures within  the last year and has one seizure every 2-3 months.   PAST MEDICAL HISTORY:  1. Seizures.  2. High cholesterol.   CURRENT MEDICATIONS:  1. Tegretol.  2. Zocor.   ALLERGIES:  None known.   FAMILY HISTORY:  Positive for hypertension.   SOCIAL HISTORY:  The patient lives with her husband.  Denies  any  smoking, alcohol or drug use.   REVIEW OF SYSTEMS:  Positive as per HPI, also for occasional shortness  of breath.  Review of systems negative as per HPI in greater than  eight  other organ systems.   PHYSICAL EXAMINATION:  VITALS:  Temperature is 97.1, blood pressure is  130/73 pulse 64, respirations 20, O2 sat 99%.  HEENT:  Normocephalic, atraumatic.  Extraocular muscles are intact.  Pupils equal and reactive to light.  NECK:  Supple.  No carotid bruits.  HEART:  Regular.  LUNGS:  Clear.  ABDOMEN:  Soft.  EXTREMITIES:  Show good pulses with no edema.  NEUROLOGICAL: The patient is awake, alert, following commands  appropriately.  Language is fluent.  Cranial nerves II-XII grossly  intact.  Motor examination shows 5/5 strength and normal tone in all  four extremities.  No drift is noted.  Sensory examination is within  normal limits  to light touch.  Reflexes are 2-3+ throughout.  Toes are  downgoing bilaterally.  Cerebellar function is within normal limits  finger-to-nose.  Gait is not assessed secondary to safety.   LABORATORY DATA:  Hemoglobin 13.3, hematocrit 39.0, sodium is 134,  potassium is 3.8, chloride is 103, CO2 is 28, BUN 11, glucose 85.  Tegretol level is 9.9.  CT head was negative for  acute process or bleed.   IMPRESSION:  This is a 70 year old African American female with syncope.  Her events are different from her typical seizure episodes.  I had  workup evaluation for cardiac source of possibly orthostatics syncope.  Would recommend getting an MRI, MRA of the brain and an EEG to rule out  possible other etiologies.  I will continue her Tegretol at her current  dose as it is therapeutic.  We will follow the patient while she is in  the hospital.      Bevelyn Buckles. Nash Shearer, M.D.  Electronically Signed     DRC/MEDQ  D:  05/05/2006  T:  05/06/2006  Job:  [id number]

## 2010-08-03 ENCOUNTER — Other Ambulatory Visit (HOSPITAL_COMMUNITY): Payer: Self-pay

## 2010-08-03 ENCOUNTER — Ambulatory Visit (HOSPITAL_COMMUNITY): Payer: Self-pay | Admitting: Oncology

## 2010-08-03 ENCOUNTER — Encounter: Payer: Self-pay | Admitting: *Deleted

## 2010-08-05 NOTE — Progress Notes (Signed)
Pacer remote check  

## 2010-08-07 ENCOUNTER — Encounter (HOSPITAL_COMMUNITY): Payer: Medicare Other | Attending: Oncology | Admitting: Oncology

## 2010-10-29 ENCOUNTER — Encounter: Payer: Self-pay | Admitting: Internal Medicine

## 2010-10-29 ENCOUNTER — Ambulatory Visit (INDEPENDENT_AMBULATORY_CARE_PROVIDER_SITE_OTHER): Payer: Medicare Other | Admitting: *Deleted

## 2010-10-29 ENCOUNTER — Other Ambulatory Visit: Payer: Self-pay

## 2010-10-29 DIAGNOSIS — I495 Sick sinus syndrome: Secondary | ICD-10-CM

## 2010-10-29 DIAGNOSIS — I471 Supraventricular tachycardia, unspecified: Secondary | ICD-10-CM

## 2010-10-29 LAB — REMOTE PACEMAKER DEVICE
AL AMPLITUDE: 1.6 mv
AL IMPEDENCE PM: 410 Ohm
RV LEAD IMPEDENCE PM: 600 Ohm
VENTRICULAR PACING PM: 1

## 2010-11-06 ENCOUNTER — Encounter: Payer: Self-pay | Admitting: *Deleted

## 2010-11-06 NOTE — Progress Notes (Signed)
Pacer remote check  

## 2011-01-24 ENCOUNTER — Encounter: Payer: Self-pay | Admitting: Internal Medicine

## 2011-01-26 ENCOUNTER — Encounter: Payer: Self-pay | Admitting: Cardiology

## 2011-01-26 ENCOUNTER — Encounter: Payer: Self-pay | Admitting: Internal Medicine

## 2011-01-26 ENCOUNTER — Ambulatory Visit (INDEPENDENT_AMBULATORY_CARE_PROVIDER_SITE_OTHER): Payer: 59 | Admitting: *Deleted

## 2011-01-26 DIAGNOSIS — I495 Sick sinus syndrome: Secondary | ICD-10-CM

## 2011-01-26 DIAGNOSIS — R55 Syncope and collapse: Secondary | ICD-10-CM

## 2011-01-26 DIAGNOSIS — I471 Supraventricular tachycardia: Secondary | ICD-10-CM

## 2011-01-26 LAB — PACEMAKER DEVICE OBSERVATION
AL AMPLITUDE: 3.7 mv
AL THRESHOLD: 1.25 V
BAMS-0003: 70 {beats}/min
DEVICE MODEL PM: 7096923
RV LEAD IMPEDENCE PM: 600 Ohm

## 2011-01-26 NOTE — Progress Notes (Signed)
Pacer check in clinic  

## 2011-01-28 ENCOUNTER — Encounter: Payer: Medicare Other | Admitting: *Deleted

## 2011-05-27 ENCOUNTER — Encounter: Payer: Self-pay | Admitting: Internal Medicine

## 2011-05-27 ENCOUNTER — Ambulatory Visit (INDEPENDENT_AMBULATORY_CARE_PROVIDER_SITE_OTHER): Payer: 59 | Admitting: *Deleted

## 2011-05-27 DIAGNOSIS — I471 Supraventricular tachycardia: Secondary | ICD-10-CM

## 2011-05-27 DIAGNOSIS — I495 Sick sinus syndrome: Secondary | ICD-10-CM

## 2011-05-27 LAB — REMOTE PACEMAKER DEVICE
AL AMPLITUDE: 1.9 mv
AL IMPEDENCE PM: 430 Ohm
BAMS-0001: 150 {beats}/min
BAMS-0003: 70 {beats}/min
BATTERY VOLTAGE: 2.96 V
DEVICE MODEL PM: 7096923
RV LEAD AMPLITUDE: 9.5 mv
RV LEAD IMPEDENCE PM: 630 Ohm

## 2011-06-01 ENCOUNTER — Encounter: Payer: Self-pay | Admitting: *Deleted

## 2011-06-03 NOTE — Progress Notes (Signed)
Remote pacer check  

## 2011-07-28 IMAGING — CR DG CHEST 2V
2 series · 2 of 2 positions shown · non-contrast
Comparison: None

CLINICAL DATA: Status post pacemaker insertion, chest pain

CHEST - 2 VIEW

[w chest pa]
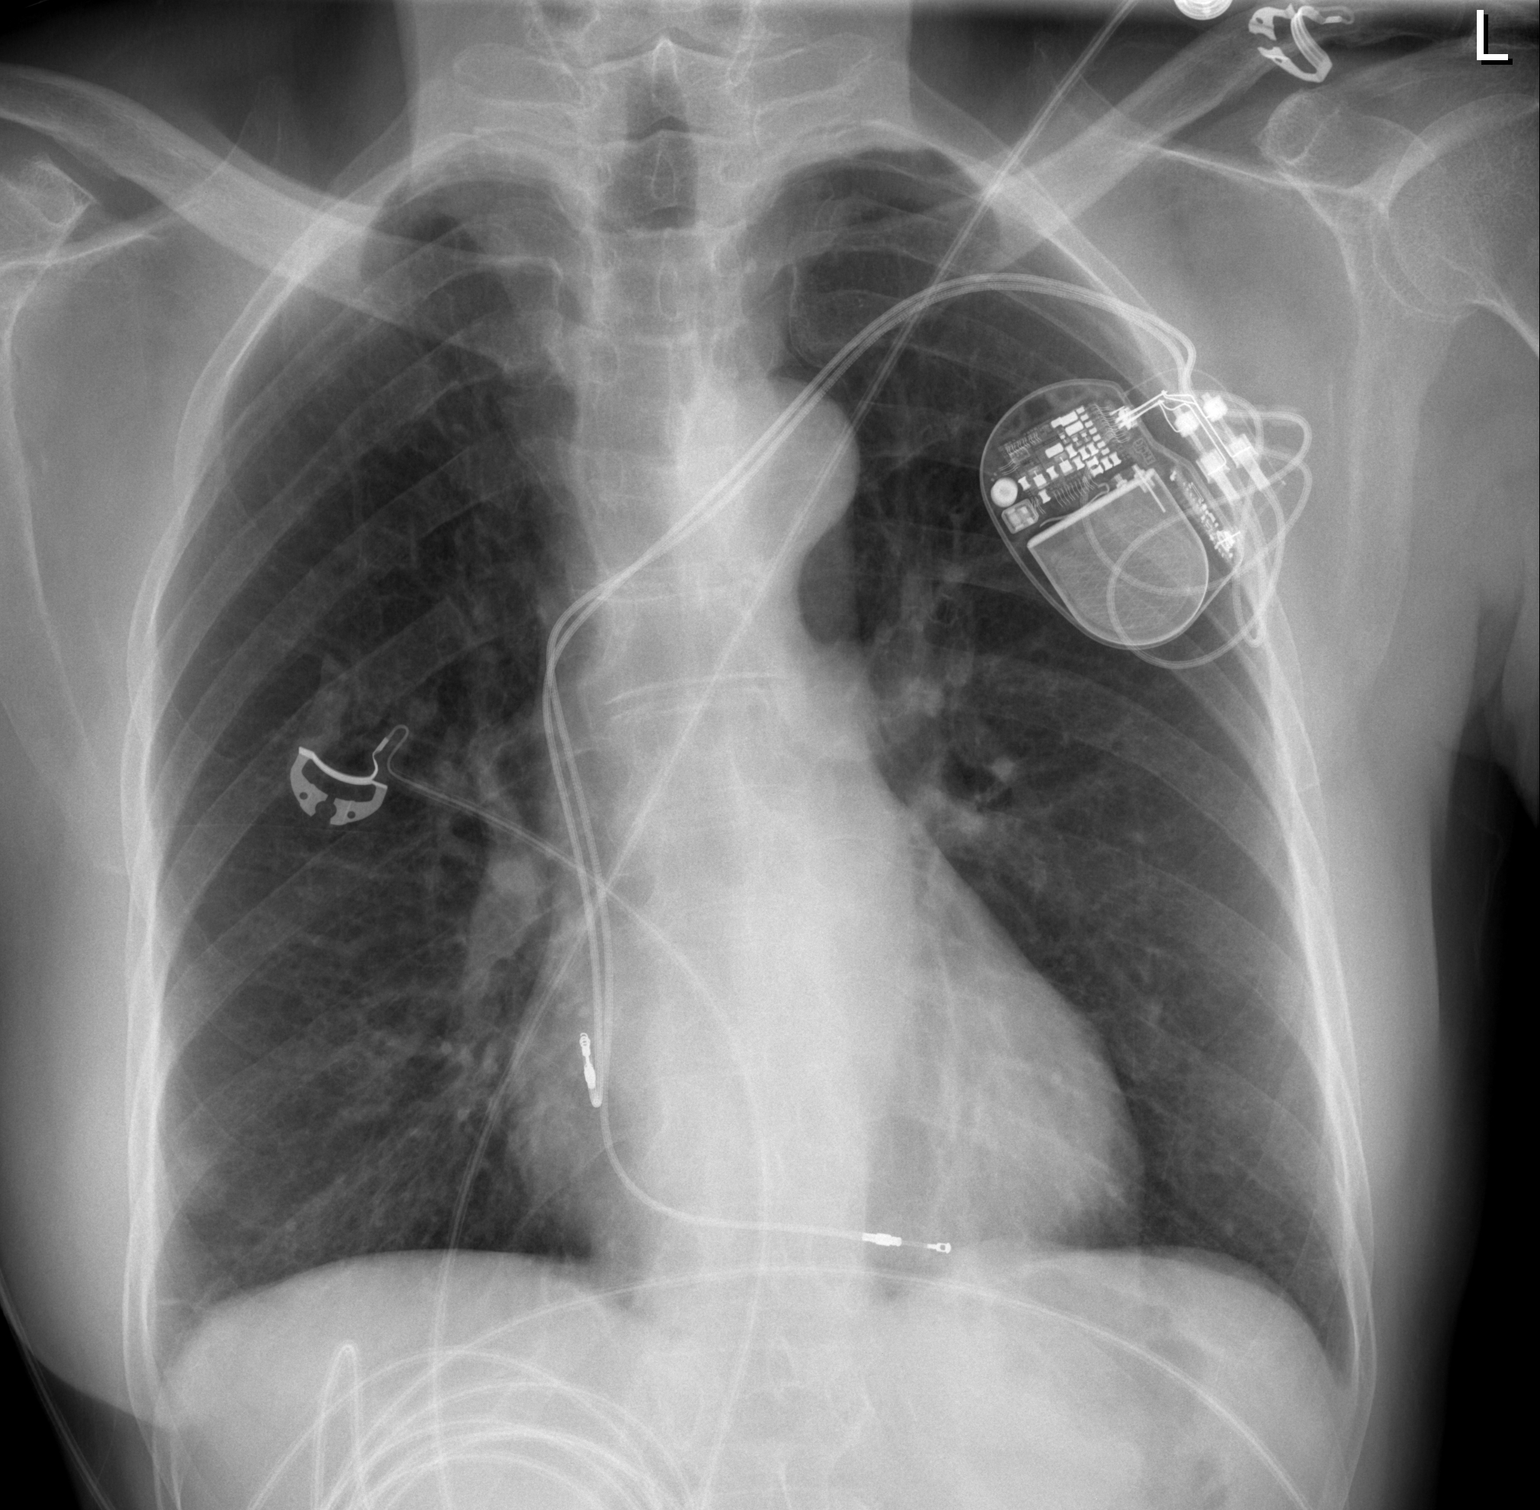

[w chest lat]
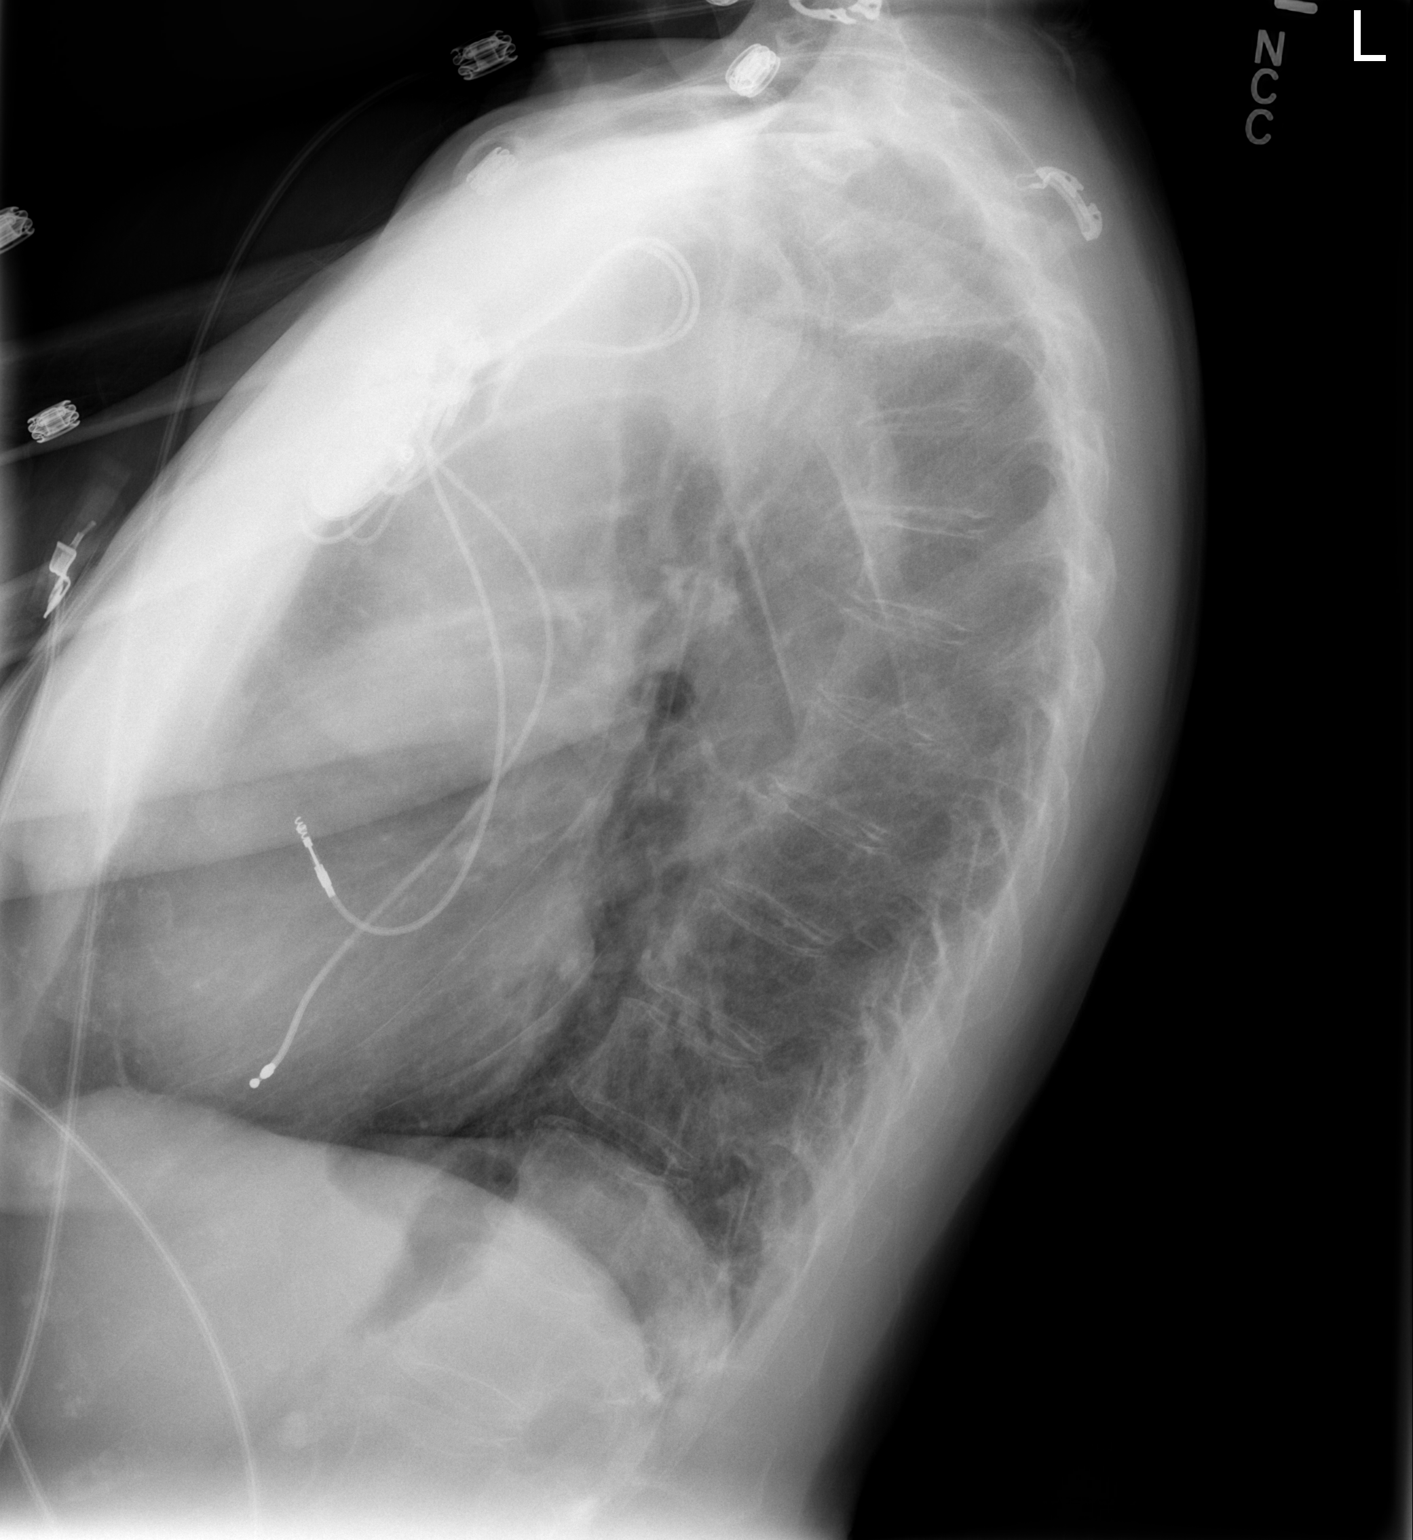

[2 of 2 positions shown; findings below may reference images not displayed]

FINDINGS: Left subclavian sequential pacemaker leads project at right atrium
and right ventricle.
Borderline cardiac enlargement.
Normal mediastinal contours and pulmonary vascularity.
Thoracic aorta is minimally tortuous.
Probable right nipple shadow.
Underlying COPD.
No infiltrate, pleural effusion or pneumothorax.
Linear subsegmental atelectasis versus scarring medial left lung
base.
IMPRESSION: Minimal atelectasis versus scarring left lung base.
Probable COPD.
No pneumothorax following pacemaker insertion.

## 2011-09-09 ENCOUNTER — Encounter: Payer: Self-pay | Admitting: Internal Medicine

## 2011-09-09 ENCOUNTER — Ambulatory Visit (INDEPENDENT_AMBULATORY_CARE_PROVIDER_SITE_OTHER): Payer: 59 | Admitting: Internal Medicine

## 2011-09-09 VITALS — BP 134/81 | HR 68 | Resp 18 | Ht 60.0 in | Wt 99.0 lb

## 2011-09-09 DIAGNOSIS — I495 Sick sinus syndrome: Secondary | ICD-10-CM

## 2011-09-09 DIAGNOSIS — I471 Supraventricular tachycardia: Secondary | ICD-10-CM

## 2011-09-09 LAB — PACEMAKER DEVICE OBSERVATION
AL THRESHOLD: 1 V
ATRIAL PACING PM: 1
BAMS-0001: 150 {beats}/min
BAMS-0003: 70 {beats}/min
DEVICE MODEL PM: 7096923
RV LEAD THRESHOLD: 0.75 V
VENTRICULAR PACING PM: 1

## 2011-09-09 NOTE — Assessment & Plan Note (Signed)
Well controlled. No changes today. 

## 2011-09-09 NOTE — Progress Notes (Signed)
PCP: Evlyn Courier, MD  Rhonda Hill is a 71 y.o. female who presents today for routine electrophysiology followup.  Since last being seen in our clinic, the patient reports doing very well.  Today, she denies symptoms of palpitations, chest pain, shortness of breath,  lower extremity edema, dizziness, presyncope, or syncope.  The patient is otherwise without complaint today.   Past Medical History  Diagnosis Date  . Sinoatrial node dysfunction     with syncope, s/p PPM (SJM)  . Seizure disorder     followed by Dr Sandria Manly  . Paroxysmal supraventricular tachycardia   . Personal history of tobacco use, presenting hazards to health    Past Surgical History  Procedure Date  . Abdominal hysterectomy   . Pacemaker insertion 04/11/09    implanted by JA (SJM) for sick sinus    Current Outpatient Prescriptions  Medication Sig Dispense Refill  . carbamazepine (TEGRETOL) 200 MG tablet Take 600 mg by mouth 2 (two) times daily.        Marland Kitchen donepezil (ARICEPT) 5 MG tablet Take 5 mg by mouth at bedtime.      . levETIRAcetam (KEPPRA) 500 MG tablet Take 1,000 mg by mouth every 12 (twelve) hours.          Physical Exam: Filed Vitals:   09/09/11 0846  BP: 134/81  Pulse: 68  Resp: 18  Height: 5' (1.524 m)  Weight: 99 lb (44.906 kg)    GEN- The patient is well appearing, alert and oriented x 3 today.   Head- normocephalic, atraumatic Eyes-  Sclera clear, conjunctiva pink Ears- hearing intact Oropharynx- clear Lungs- Clear to ausculation bilaterally, normal work of breathing Chest- pacemaker pocket is well healed Heart- Regular rate and rhythm, no murmurs, rubs or gallops, PMI not laterally displaced GI- soft, NT, ND, + BS Extremities- no clubbing, cyanosis, or edema  Pacemaker interrogation- reviewed in detail today,  See PACEART report  Assessment and Plan:

## 2011-09-09 NOTE — Assessment & Plan Note (Signed)
Normal pacemaker function See Pace Art report No changes today  

## 2011-09-09 NOTE — Patient Instructions (Addendum)
Phone device checks every 3 months.  Follow up with Dr. Johney Frame in 1 year. You will receive a reminder letter in the mail in about 10 months reminding you to call and schedule your appointment. If you don't receive this letter, please contact our office.   Your physician recommends that you continue on your current medications as directed. Please refer to the Current Medication list given to you today.

## 2011-11-28 DIAGNOSIS — R569 Unspecified convulsions: Secondary | ICD-10-CM

## 2011-12-13 ENCOUNTER — Ambulatory Visit (INDEPENDENT_AMBULATORY_CARE_PROVIDER_SITE_OTHER): Payer: 59 | Admitting: *Deleted

## 2011-12-13 ENCOUNTER — Encounter: Payer: Self-pay | Admitting: *Deleted

## 2011-12-13 ENCOUNTER — Encounter: Payer: Self-pay | Admitting: Internal Medicine

## 2011-12-13 DIAGNOSIS — I495 Sick sinus syndrome: Secondary | ICD-10-CM

## 2011-12-13 DIAGNOSIS — Z950 Presence of cardiac pacemaker: Secondary | ICD-10-CM

## 2011-12-13 HISTORY — DX: Presence of cardiac pacemaker: Z95.0

## 2011-12-13 LAB — REMOTE PACEMAKER DEVICE
AL AMPLITUDE: 2.7 mv
ATRIAL PACING PM: 1
BAMS-0001: 150 {beats}/min

## 2011-12-14 NOTE — Progress Notes (Signed)
Remote pacer check  

## 2012-01-03 ENCOUNTER — Encounter: Payer: Self-pay | Admitting: Internal Medicine

## 2012-01-13 ENCOUNTER — Telehealth: Payer: Self-pay | Admitting: Internal Medicine

## 2012-01-13 NOTE — Telephone Encounter (Addendum)
kristen left message for pt to call to set up appt with allred to adjust atrial sensing to prevent mode switching. 01-03-12 n/a, voicemail full. Sent letter. 01-13-12 n/a, unable to leave message/mt 01-19-12 n/a, mailbox full/mt

## 2012-02-25 ENCOUNTER — Encounter: Payer: Self-pay | Admitting: Internal Medicine

## 2012-02-25 ENCOUNTER — Ambulatory Visit (INDEPENDENT_AMBULATORY_CARE_PROVIDER_SITE_OTHER): Payer: 59 | Admitting: *Deleted

## 2012-02-25 DIAGNOSIS — I498 Other specified cardiac arrhythmias: Secondary | ICD-10-CM

## 2012-02-25 LAB — PACEMAKER DEVICE OBSERVATION
AL IMPEDENCE PM: 400 Ohm
BAMS-0003: 70 {beats}/min
RV LEAD IMPEDENCE PM: 625 Ohm

## 2012-02-25 NOTE — Progress Notes (Signed)
Pacer changes per Fawn Kirk

## 2012-03-20 ENCOUNTER — Ambulatory Visit (INDEPENDENT_AMBULATORY_CARE_PROVIDER_SITE_OTHER): Payer: 59 | Admitting: *Deleted

## 2012-03-20 ENCOUNTER — Telehealth: Payer: Self-pay | Admitting: Internal Medicine

## 2012-03-20 ENCOUNTER — Encounter: Payer: Self-pay | Admitting: Internal Medicine

## 2012-03-20 DIAGNOSIS — I495 Sick sinus syndrome: Secondary | ICD-10-CM

## 2012-03-20 DIAGNOSIS — Z950 Presence of cardiac pacemaker: Secondary | ICD-10-CM

## 2012-03-20 LAB — REMOTE PACEMAKER DEVICE
AL AMPLITUDE: 1.4 mv
ATRIAL PACING PM: 1
BAMS-0001: 150 {beats}/min
BAMS-0003: 70 {beats}/min
RV LEAD AMPLITUDE: 8.7 mv
RV LEAD IMPEDENCE PM: 600 Ohm
VENTRICULAR PACING PM: 1

## 2012-03-20 NOTE — Telephone Encounter (Signed)
New Problem:    Called in wanting to know if the patient's transmission was received today.  Please call back.

## 2012-03-20 NOTE — Telephone Encounter (Signed)
Spoke with daughter and she will have Rhonda Hill in the [place] office 03/24/12@ 11:00am so we can reprogram ppm.

## 2012-03-24 ENCOUNTER — Ambulatory Visit (INDEPENDENT_AMBULATORY_CARE_PROVIDER_SITE_OTHER): Payer: 59 | Admitting: *Deleted

## 2012-03-24 DIAGNOSIS — I495 Sick sinus syndrome: Secondary | ICD-10-CM

## 2012-03-24 LAB — PACEMAKER DEVICE OBSERVATION: DEVICE MODEL PM: 7096923

## 2012-03-24 NOTE — Progress Notes (Signed)
Changed atrial sensitivity to 0.62mV.

## 2012-04-14 ENCOUNTER — Encounter: Payer: Self-pay | Admitting: Internal Medicine

## 2012-06-19 ENCOUNTER — Other Ambulatory Visit: Payer: Self-pay | Admitting: Internal Medicine

## 2012-06-19 ENCOUNTER — Ambulatory Visit (INDEPENDENT_AMBULATORY_CARE_PROVIDER_SITE_OTHER): Payer: 59 | Admitting: *Deleted

## 2012-06-19 DIAGNOSIS — Z950 Presence of cardiac pacemaker: Secondary | ICD-10-CM

## 2012-06-19 DIAGNOSIS — I495 Sick sinus syndrome: Secondary | ICD-10-CM

## 2012-06-20 LAB — REMOTE PACEMAKER DEVICE
ATRIAL PACING PM: 1
BAMS-0001: 150 {beats}/min
BAMS-0003: 70 {beats}/min
DEVICE MODEL PM: 7096923
VENTRICULAR PACING PM: 1

## 2012-06-26 ENCOUNTER — Encounter: Payer: Self-pay | Admitting: *Deleted

## 2012-09-25 ENCOUNTER — Ambulatory Visit (INDEPENDENT_AMBULATORY_CARE_PROVIDER_SITE_OTHER): Payer: 59 | Admitting: *Deleted

## 2012-09-25 ENCOUNTER — Encounter: Payer: Self-pay | Admitting: Internal Medicine

## 2012-09-25 DIAGNOSIS — Z950 Presence of cardiac pacemaker: Secondary | ICD-10-CM

## 2012-09-25 DIAGNOSIS — I495 Sick sinus syndrome: Secondary | ICD-10-CM

## 2012-09-25 LAB — REMOTE PACEMAKER DEVICE
AL AMPLITUDE: 1.4 mv
BAMS-0003: 70 {beats}/min
RV LEAD AMPLITUDE: 8.3 mv
RV LEAD IMPEDENCE PM: 600 Ohm

## 2012-09-29 ENCOUNTER — Encounter: Payer: Self-pay | Admitting: *Deleted

## 2012-11-06 ENCOUNTER — Ambulatory Visit (INDEPENDENT_AMBULATORY_CARE_PROVIDER_SITE_OTHER): Payer: 59 | Admitting: Internal Medicine

## 2012-11-06 ENCOUNTER — Encounter: Payer: Self-pay | Admitting: Internal Medicine

## 2012-11-06 VITALS — BP 134/78 | HR 72 | Ht 60.0 in | Wt 101.1 lb

## 2012-11-06 DIAGNOSIS — I495 Sick sinus syndrome: Secondary | ICD-10-CM

## 2012-11-06 DIAGNOSIS — I471 Supraventricular tachycardia, unspecified: Secondary | ICD-10-CM

## 2012-11-06 LAB — PACEMAKER DEVICE OBSERVATION
AL AMPLITUDE: 2.5 mv
AL IMPEDENCE PM: 400 Ohm
AL THRESHOLD: 1 V
ATRIAL PACING PM: 1
BAMS-0001: 150 {beats}/min
BAMS-0003: 70 {beats}/min
BATTERY VOLTAGE: 2.9478 V
DEVICE MODEL PM: 7096923
RV LEAD AMPLITUDE: 8.7 mv
RV LEAD IMPEDENCE PM: 625 Ohm
RV LEAD THRESHOLD: 0.75 V
VENTRICULAR PACING PM: 1

## 2012-11-06 NOTE — Progress Notes (Signed)
PCP: Evlyn Courier, MD  Rhonda Hill is a 72 y.o. female who presents today for routine electrophysiology followup.  Since last being seen in our clinic, the patient reports doing very well.  Today, she denies symptoms of palpitations, chest pain, shortness of breath,  lower extremity edema, dizziness, presyncope, or syncope.  The patient is otherwise without complaint today.   Past Medical History  Diagnosis Date  . Sinoatrial node dysfunction     with syncope, s/p PPM (SJM)  . Seizure disorder     followed by Dr Sandria Manly  . Paroxysmal supraventricular tachycardia   . Personal history of tobacco use, presenting hazards to health    Past Surgical History  Procedure Laterality Date  . Abdominal hysterectomy    . Pacemaker insertion  04/11/09    implanted by JA (SJM) for sick sinus    Current Outpatient Prescriptions  Medication Sig Dispense Refill  . carbamazepine (TEGRETOL) 200 MG tablet Take 600 mg by mouth 2 (two) times daily.        Marland Kitchen donepezil (ARICEPT) 5 MG tablet Take 5 mg by mouth at bedtime.      . levETIRAcetam (KEPPRA) 500 MG tablet Take 1,000 mg by mouth every 12 (twelve) hours.         No current facility-administered medications for this visit.    Physical Exam: Filed Vitals:   11/06/12 1535  BP: 134/78  Pulse: 72  Height: 5' (1.524 m)  Weight: 101 lb 1.9 oz (45.868 kg)    GEN- The patient is well appearing, alert and oriented x 3 today.   Head- normocephalic, atraumatic Eyes-  Sclera clear, conjunctiva pink Ears- hearing intact Oropharynx- clear Lungs- Clear to ausculation bilaterally, normal work of breathing Chest- pacemaker pocket is well healed Heart- Regular rate and rhythm, no murmurs, rubs or gallops, PMI not laterally displaced GI- soft, NT, ND, + BS Extremities- no clubbing, cyanosis, or edema  Pacemaker interrogation- reviewed in detail today,  See PACEART report  Assessment and Plan:  1. Sick sinus syndrome Normal pacemaker function See  Pace Art report No changes today  2. SVT Interrogation reveals only sinus tach No sustained SVT observed  Merlin Return in 1 year

## 2012-11-06 NOTE — Patient Instructions (Signed)
Continue all current medications. Allred - Your physician wants you to follow up in:  1 year.  You will receive a reminder letter in the mail one-two months in advance.  If you don't receive a letter, please call our office to schedule the follow up appointment

## 2012-12-19 ENCOUNTER — Encounter: Payer: Self-pay | Admitting: Internal Medicine

## 2013-02-12 ENCOUNTER — Encounter (INDEPENDENT_AMBULATORY_CARE_PROVIDER_SITE_OTHER): Payer: 59 | Admitting: *Deleted

## 2013-02-12 ENCOUNTER — Other Ambulatory Visit: Payer: Self-pay | Admitting: Internal Medicine

## 2013-02-12 ENCOUNTER — Encounter: Payer: Self-pay | Admitting: Internal Medicine

## 2013-02-12 DIAGNOSIS — I495 Sick sinus syndrome: Secondary | ICD-10-CM

## 2013-02-12 DIAGNOSIS — Z950 Presence of cardiac pacemaker: Secondary | ICD-10-CM

## 2013-02-18 LAB — MDC_IDC_ENUM_SESS_TYPE_REMOTE
Battery Remaining Longevity: 91 mo
Battery Voltage: 2.95 V
Brady Statistic AP VP Percent: 1 %
Brady Statistic AS VP Percent: 1 %
Brady Statistic RA Percent Paced: 1 %
Brady Statistic RV Percent Paced: 1 %
Implantable Pulse Generator Model: 2210
Implantable Pulse Generator Serial Number: 7096923
Lead Channel Impedance Value: 430 Ohm
Lead Channel Pacing Threshold Amplitude: 0.75 V
Lead Channel Pacing Threshold Amplitude: 1 V
Lead Channel Pacing Threshold Pulse Width: 0.4 ms
Lead Channel Sensing Intrinsic Amplitude: 1.5 mV
Lead Channel Setting Pacing Amplitude: 2 V

## 2013-02-27 ENCOUNTER — Encounter: Payer: Self-pay | Admitting: *Deleted

## 2013-05-16 ENCOUNTER — Encounter: Payer: 59 | Admitting: *Deleted

## 2013-05-22 ENCOUNTER — Encounter: Payer: Self-pay | Admitting: Internal Medicine

## 2013-06-08 ENCOUNTER — Encounter: Payer: Self-pay | Admitting: Internal Medicine

## 2013-06-08 ENCOUNTER — Ambulatory Visit (INDEPENDENT_AMBULATORY_CARE_PROVIDER_SITE_OTHER): Payer: 59 | Admitting: *Deleted

## 2013-06-08 DIAGNOSIS — I471 Supraventricular tachycardia: Secondary | ICD-10-CM

## 2013-06-08 DIAGNOSIS — I495 Sick sinus syndrome: Secondary | ICD-10-CM

## 2013-06-08 LAB — MDC_IDC_ENUM_SESS_TYPE_INCLINIC
Battery Voltage: 2.93 V
Date Time Interrogation Session: 20150327132125
Implantable Pulse Generator Model: 2210
Implantable Pulse Generator Serial Number: 7096923
Lead Channel Pacing Threshold Amplitude: 0.75 V
Lead Channel Pacing Threshold Pulse Width: 0.4 ms
Lead Channel Pacing Threshold Pulse Width: 0.4 ms
Lead Channel Sensing Intrinsic Amplitude: 1.2 mV
Lead Channel Sensing Intrinsic Amplitude: 7.5 mV
Lead Channel Setting Pacing Amplitude: 2.5 V
Lead Channel Setting Pacing Pulse Width: 0.4 ms
Lead Channel Setting Sensing Sensitivity: 2 mV
MDC IDC MSMT BATTERY REMAINING LONGEVITY: 92.4 mo
MDC IDC MSMT LEADCHNL RA IMPEDANCE VALUE: 362.5 Ohm
MDC IDC MSMT LEADCHNL RA PACING THRESHOLD AMPLITUDE: 1 V
MDC IDC MSMT LEADCHNL RV IMPEDANCE VALUE: 600 Ohm
MDC IDC SET LEADCHNL RA PACING AMPLITUDE: 2 V
MDC IDC STAT BRADY RA PERCENT PACED: 0.35 %
MDC IDC STAT BRADY RV PERCENT PACED: 0 %

## 2013-06-08 NOTE — Progress Notes (Signed)
Pacemaker check in clinic. Battery longvity 6.2 to 7.7 years. Normal device function. 36 mode switches--longest was 11 minutes 10 seconds. 2 high ventricular rate episodes. ROV 09-03-13 @ 1130 with JA/[place].

## 2013-09-03 ENCOUNTER — Encounter: Payer: Self-pay | Admitting: Internal Medicine

## 2013-09-03 ENCOUNTER — Ambulatory Visit (INDEPENDENT_AMBULATORY_CARE_PROVIDER_SITE_OTHER): Payer: 59 | Admitting: Internal Medicine

## 2013-09-03 VITALS — BP 128/75 | HR 64 | Ht 60.0 in | Wt 101.0 lb

## 2013-09-03 DIAGNOSIS — Z950 Presence of cardiac pacemaker: Secondary | ICD-10-CM

## 2013-09-03 DIAGNOSIS — I495 Sick sinus syndrome: Secondary | ICD-10-CM

## 2013-09-03 DIAGNOSIS — I471 Supraventricular tachycardia: Secondary | ICD-10-CM

## 2013-09-03 LAB — MDC_IDC_ENUM_SESS_TYPE_INCLINIC
Battery Remaining Longevity: 114 mo
Battery Voltage: 2.93 V
Brady Statistic RA Percent Paced: 0.22 %
Brady Statistic RV Percent Paced: 0 %
Implantable Pulse Generator Model: 2210
Implantable Pulse Generator Serial Number: 7096923
Lead Channel Impedance Value: 625 Ohm
Lead Channel Pacing Threshold Amplitude: 1 V
Lead Channel Pacing Threshold Amplitude: 1 V
Lead Channel Pacing Threshold Pulse Width: 0.4 ms
Lead Channel Pacing Threshold Pulse Width: 0.4 ms
Lead Channel Sensing Intrinsic Amplitude: 3.7 mV
Lead Channel Setting Pacing Pulse Width: 0.4 ms
Lead Channel Setting Sensing Sensitivity: 2 mV
MDC IDC MSMT LEADCHNL RA IMPEDANCE VALUE: 400 Ohm
MDC IDC MSMT LEADCHNL RV PACING THRESHOLD AMPLITUDE: 0.75 V
MDC IDC MSMT LEADCHNL RV PACING THRESHOLD AMPLITUDE: 0.75 V
MDC IDC MSMT LEADCHNL RV PACING THRESHOLD PULSEWIDTH: 0.4 ms
MDC IDC MSMT LEADCHNL RV PACING THRESHOLD PULSEWIDTH: 0.4 ms
MDC IDC MSMT LEADCHNL RV SENSING INTR AMPL: 8.2 mV
MDC IDC SESS DTM: 20150622120644
MDC IDC SET LEADCHNL RA PACING AMPLITUDE: 2 V
MDC IDC SET LEADCHNL RV PACING AMPLITUDE: 2.5 V

## 2013-09-03 NOTE — Patient Instructions (Signed)
   Merlin/Home device check 12/05/13. Your physician recommends that you schedule a follow-up appointment in: 1 year with Dr. Rayann Heman. You will receive a reminder letter in the mail in about 10 months reminding you to call and schedule your appointment. If you don't receive this letter, please contact our office. Your physician recommends that you continue on your current medications as directed. Please refer to the Current Medication list given to you today.

## 2013-09-03 NOTE — Progress Notes (Signed)
PCP: Gar Ponto, MD  Rhonda Hill is a 73 y.o. female who presents today for routine electrophysiology followup.  Since last being seen in our clinic, the patient reports doing very well.  Today, she denies symptoms of palpitations, chest pain, shortness of breath,  lower extremity edema, dizziness, presyncope, or syncope.  The patient is otherwise without complaint today.   Past Medical History  Diagnosis Date  . Sinoatrial node dysfunction     with syncope, s/p PPM (SJM)  . Seizure disorder     followed by Dr Erling Cruz  . Paroxysmal supraventricular tachycardia   . Personal history of tobacco use, presenting hazards to health    Past Surgical History  Procedure Laterality Date  . Abdominal hysterectomy    . Pacemaker insertion  04/11/09    implanted by JA (SJM) for sick sinus    Current Outpatient Prescriptions  Medication Sig Dispense Refill  . carbamazepine (TEGRETOL) 200 MG tablet Take 600 mg by mouth 2 (two) times daily.        Marland Kitchen donepezil (ARICEPT) 5 MG tablet Take 5 mg by mouth at bedtime.      . levETIRAcetam (KEPPRA) 500 MG tablet Take 1,000 mg by mouth every 12 (twelve) hours.         No current facility-administered medications for this visit.    Physical Exam: Filed Vitals:   09/03/13 1137  BP: 128/75  Pulse: 64  Height: 5' (1.524 m)  Weight: 101 lb (45.813 kg)  SpO2: 100%    GEN- The patient is well appearing, alert and oriented x 3 today.   Head- normocephalic, atraumatic Eyes-  Sclera clear, conjunctiva pink Ears- hearing intact Oropharynx- clear Lungs- Clear to ausculation bilaterally, normal work of breathing Chest- pacemaker pocket is well healed Heart- Regular rate and rhythm, no murmurs, rubs or gallops, PMI not laterally displaced GI- soft, NT, ND, + BS Extremities- no clubbing, cyanosis, or edema  Pacemaker interrogation- reviewed in detail today,  See PACEART report  Assessment and Plan:  1. Sick sinus syndrome Normal pacemaker  function See Pace Art report No changes today  2. SVT Interrogation reveals only sinus tach She did have far field sensing on the atrial channel which causes inappropriate mode switching  Merlin Return in 1 year

## 2013-10-09 ENCOUNTER — Other Ambulatory Visit: Payer: Self-pay | Admitting: Cardiology

## 2013-10-29 ENCOUNTER — Encounter: Payer: Self-pay | Admitting: Internal Medicine

## 2013-11-07 ENCOUNTER — Encounter: Payer: Self-pay | Admitting: Internal Medicine

## 2013-11-13 ENCOUNTER — Encounter: Payer: Self-pay | Admitting: Internal Medicine

## 2013-12-05 ENCOUNTER — Encounter: Payer: Self-pay | Admitting: Internal Medicine

## 2013-12-05 ENCOUNTER — Ambulatory Visit (INDEPENDENT_AMBULATORY_CARE_PROVIDER_SITE_OTHER): Payer: 59 | Admitting: *Deleted

## 2013-12-05 DIAGNOSIS — I495 Sick sinus syndrome: Secondary | ICD-10-CM

## 2013-12-05 LAB — MDC_IDC_ENUM_SESS_TYPE_REMOTE
Battery Remaining Longevity: 87 mo
Brady Statistic AP VS Percent: 1 %
Brady Statistic AS VP Percent: 1 %
Brady Statistic AS VS Percent: 99 %
Brady Statistic RA Percent Paced: 1 %
Brady Statistic RV Percent Paced: 1 %
Implantable Pulse Generator Serial Number: 7096923
Lead Channel Impedance Value: 600 Ohm
Lead Channel Pacing Threshold Amplitude: 0.75 V
Lead Channel Pacing Threshold Amplitude: 1 V
Lead Channel Sensing Intrinsic Amplitude: 10.5 mV
Lead Channel Setting Pacing Amplitude: 2 V
Lead Channel Setting Pacing Amplitude: 2.5 V
Lead Channel Setting Sensing Sensitivity: 2 mV
MDC IDC MSMT BATTERY REMAINING PERCENTAGE: 67 %
MDC IDC MSMT BATTERY VOLTAGE: 2.93 V
MDC IDC MSMT LEADCHNL RA IMPEDANCE VALUE: 400 Ohm
MDC IDC MSMT LEADCHNL RA PACING THRESHOLD PULSEWIDTH: 0.4 ms
MDC IDC MSMT LEADCHNL RA SENSING INTR AMPL: 4.3 mV
MDC IDC MSMT LEADCHNL RV PACING THRESHOLD PULSEWIDTH: 0.4 ms
MDC IDC SESS DTM: 20150923060009
MDC IDC SET LEADCHNL RV PACING PULSEWIDTH: 0.4 ms
MDC IDC STAT BRADY AP VP PERCENT: 1 %

## 2013-12-05 NOTE — Progress Notes (Signed)
Remote pacemaker transmission.   

## 2013-12-11 ENCOUNTER — Other Ambulatory Visit: Payer: Self-pay | Admitting: Cardiology

## 2013-12-17 ENCOUNTER — Encounter: Payer: Self-pay | Admitting: Cardiology

## 2014-01-22 ENCOUNTER — Telehealth: Payer: Self-pay | Admitting: Internal Medicine

## 2014-01-22 NOTE — Telephone Encounter (Signed)
Daughter just called and states that her mother's pacemaker is going off. States mother feels fine but is concerned about pacemaker.

## 2014-01-22 NOTE — Telephone Encounter (Signed)
Spoke with pt daughter and she stated that pt home monitor for her pacemaker is going off. Pt daughter stated that her mother may not be able to do any trouble shooting with me and that it may be best if she call me when she gets to her moms off before trouble shooting monitor. Gave pt daughter to [facility] office and informed her I was here until 5PM. I informed pt daughter that we have not received any alerts on pt. Pt daughter verbalized understanding and stated that she would have mother unplug monitor. I informed her that this is ok. She will call back later to receive help with trouble shooting.

## 2014-01-23 ENCOUNTER — Encounter: Payer: Self-pay | Admitting: Internal Medicine

## 2014-01-25 NOTE — Telephone Encounter (Signed)
Spoke with pt daughter and informed and asked if she had solved the issue with her mom's home monitor she stated that she had solved the issue.

## 2014-02-04 ENCOUNTER — Encounter: Payer: Self-pay | Admitting: Internal Medicine

## 2014-03-11 ENCOUNTER — Ambulatory Visit (INDEPENDENT_AMBULATORY_CARE_PROVIDER_SITE_OTHER): Payer: 59 | Admitting: *Deleted

## 2014-03-11 ENCOUNTER — Encounter: Payer: Self-pay | Admitting: Internal Medicine

## 2014-03-11 DIAGNOSIS — I471 Supraventricular tachycardia, unspecified: Secondary | ICD-10-CM

## 2014-03-11 DIAGNOSIS — I495 Sick sinus syndrome: Secondary | ICD-10-CM

## 2014-03-11 LAB — MDC_IDC_ENUM_SESS_TYPE_REMOTE
Battery Voltage: 2.93 V
Brady Statistic AP VP Percent: 1 %
Brady Statistic AS VP Percent: 1 %
Brady Statistic AS VS Percent: 99 %
Brady Statistic RA Percent Paced: 1 %
Brady Statistic RV Percent Paced: 1 %
Date Time Interrogation Session: 20151228070028
Implantable Pulse Generator Model: 2210
Lead Channel Impedance Value: 360 Ohm
Lead Channel Impedance Value: 630 Ohm
Lead Channel Pacing Threshold Amplitude: 1 V
Lead Channel Sensing Intrinsic Amplitude: 1.6 mV
Lead Channel Sensing Intrinsic Amplitude: 9.1 mV
Lead Channel Setting Pacing Amplitude: 2.5 V
MDC IDC MSMT BATTERY REMAINING LONGEVITY: 86 mo
MDC IDC MSMT BATTERY REMAINING PERCENTAGE: 67 %
MDC IDC MSMT LEADCHNL RA PACING THRESHOLD PULSEWIDTH: 0.4 ms
MDC IDC MSMT LEADCHNL RV PACING THRESHOLD AMPLITUDE: 0.75 V
MDC IDC MSMT LEADCHNL RV PACING THRESHOLD PULSEWIDTH: 0.4 ms
MDC IDC PG SERIAL: 7096923
MDC IDC SET LEADCHNL RA PACING AMPLITUDE: 2 V
MDC IDC SET LEADCHNL RV PACING PULSEWIDTH: 0.4 ms
MDC IDC SET LEADCHNL RV SENSING SENSITIVITY: 2 mV
MDC IDC STAT BRADY AP VS PERCENT: 1 %

## 2014-03-11 NOTE — Progress Notes (Signed)
Remote pacemaker check. 

## 2014-03-22 ENCOUNTER — Encounter: Payer: Self-pay | Admitting: *Deleted

## 2014-05-13 ENCOUNTER — Encounter: Payer: Self-pay | Admitting: Internal Medicine

## 2014-06-10 ENCOUNTER — Ambulatory Visit (INDEPENDENT_AMBULATORY_CARE_PROVIDER_SITE_OTHER): Payer: 59 | Admitting: *Deleted

## 2014-06-10 ENCOUNTER — Encounter: Payer: Self-pay | Admitting: Internal Medicine

## 2014-06-10 DIAGNOSIS — I495 Sick sinus syndrome: Secondary | ICD-10-CM | POA: Diagnosis not present

## 2014-06-10 NOTE — Progress Notes (Signed)
Remote pacemaker transmission.   

## 2014-06-12 LAB — MDC_IDC_ENUM_SESS_TYPE_REMOTE
Battery Remaining Percentage: 67 %
Battery Voltage: 2.93 V
Brady Statistic AP VP Percent: 1 %
Brady Statistic AP VS Percent: 1 %
Brady Statistic AS VP Percent: 1 %
Brady Statistic AS VS Percent: 99 %
Brady Statistic RA Percent Paced: 1 %
Date Time Interrogation Session: 20160328060013
Implantable Pulse Generator Serial Number: 7096923
Lead Channel Impedance Value: 380 Ohm
Lead Channel Sensing Intrinsic Amplitude: 1.7 mV
Lead Channel Sensing Intrinsic Amplitude: 10 mV
Lead Channel Setting Pacing Amplitude: 2.5 V
Lead Channel Setting Pacing Pulse Width: 0.4 ms
Lead Channel Setting Sensing Sensitivity: 2 mV
MDC IDC MSMT BATTERY REMAINING LONGEVITY: 87 mo
MDC IDC MSMT LEADCHNL RV IMPEDANCE VALUE: 660 Ohm
MDC IDC SET LEADCHNL RA PACING AMPLITUDE: 2 V
MDC IDC STAT BRADY RV PERCENT PACED: 1 %

## 2014-06-20 ENCOUNTER — Encounter: Payer: Self-pay | Admitting: Cardiology

## 2014-09-09 ENCOUNTER — Ambulatory Visit (INDEPENDENT_AMBULATORY_CARE_PROVIDER_SITE_OTHER): Payer: Medicare Other | Admitting: *Deleted

## 2014-09-09 ENCOUNTER — Encounter: Payer: Self-pay | Admitting: Internal Medicine

## 2014-09-09 DIAGNOSIS — I495 Sick sinus syndrome: Secondary | ICD-10-CM | POA: Diagnosis not present

## 2014-09-09 NOTE — Progress Notes (Signed)
Remote pacemaker transmission.   

## 2014-09-20 LAB — CUP PACEART REMOTE DEVICE CHECK
Battery Remaining Percentage: 73 %
Battery Voltage: 2.92 V
Brady Statistic AP VP Percent: 1 %
Brady Statistic AS VS Percent: 99 %
Brady Statistic RV Percent Paced: 1 %
Date Time Interrogation Session: 20160627072746
Lead Channel Impedance Value: 600 Ohm
Lead Channel Pacing Threshold Amplitude: 0.75 V
Lead Channel Pacing Threshold Amplitude: 1 V
Lead Channel Pacing Threshold Pulse Width: 0.4 ms
Lead Channel Sensing Intrinsic Amplitude: 3.2 mV
Lead Channel Sensing Intrinsic Amplitude: 9.5 mV
Lead Channel Setting Sensing Sensitivity: 2 mV
MDC IDC MSMT BATTERY REMAINING LONGEVITY: 93 mo
MDC IDC MSMT LEADCHNL RA IMPEDANCE VALUE: 380 Ohm
MDC IDC MSMT LEADCHNL RV PACING THRESHOLD PULSEWIDTH: 0.4 ms
MDC IDC SET LEADCHNL RA PACING AMPLITUDE: 2 V
MDC IDC SET LEADCHNL RV PACING AMPLITUDE: 2.5 V
MDC IDC SET LEADCHNL RV PACING PULSEWIDTH: 0.4 ms
MDC IDC STAT BRADY AP VS PERCENT: 1 %
MDC IDC STAT BRADY AS VP PERCENT: 1 %
MDC IDC STAT BRADY RA PERCENT PACED: 1 %
Pulse Gen Model: 2210
Pulse Gen Serial Number: 7096923

## 2014-09-25 ENCOUNTER — Encounter: Payer: Self-pay | Admitting: Cardiology

## 2014-12-03 ENCOUNTER — Telehealth: Payer: Self-pay | Admitting: Cardiology

## 2014-12-03 NOTE — Telephone Encounter (Signed)
LMOVM for pt daughter to return call.  

## 2014-12-03 NOTE — Telephone Encounter (Signed)
-----   Message from Merlene Laughter, LPN sent at 03/30/5788  3:47 PM EDT ----- Regarding: pass due for follow up Daughter called to schedule this patient's follow up appointment. I gave her Allred's first available appointment which is on 03/20/14. Can someone contact her about doing a transmission or does she need to be seen sooner?

## 2014-12-05 NOTE — Telephone Encounter (Signed)
2nd attempt  LMOVM for pt daughter to return call. Pt will need a remote transmission before January appt.

## 2014-12-06 ENCOUNTER — Encounter: Payer: Self-pay | Admitting: Internal Medicine

## 2014-12-10 ENCOUNTER — Telehealth: Payer: Self-pay | Admitting: *Deleted

## 2014-12-10 NOTE — Telephone Encounter (Signed)
LMOM to return call about pacemaker remote transmission.

## 2014-12-13 NOTE — Telephone Encounter (Signed)
LMOM to call back

## 2014-12-13 NOTE — Telephone Encounter (Signed)
3rd attempt  LMOVM for pt daughter to return call.

## 2014-12-18 NOTE — Telephone Encounter (Signed)
Late entry.   Pt called back 12/17/14- she did not recollect any symptoms from 12/05/14 episode.   Daughter called 12/18/14 to clarify appointments and update contact numbers.

## 2014-12-23 ENCOUNTER — Ambulatory Visit (INDEPENDENT_AMBULATORY_CARE_PROVIDER_SITE_OTHER): Payer: Medicare Other | Admitting: *Deleted

## 2014-12-23 ENCOUNTER — Encounter: Payer: Self-pay | Admitting: Internal Medicine

## 2014-12-23 DIAGNOSIS — I495 Sick sinus syndrome: Secondary | ICD-10-CM | POA: Diagnosis not present

## 2014-12-24 NOTE — Progress Notes (Signed)
Remote pacemaker transmission.   

## 2014-12-27 LAB — CUP PACEART REMOTE DEVICE CHECK
Battery Remaining Percentage: 81 %
Brady Statistic AP VP Percent: 1 %
Brady Statistic AS VP Percent: 1 %
Brady Statistic AS VS Percent: 99 %
Brady Statistic RV Percent Paced: 1 %
Implantable Lead Implant Date: 20110128
Implantable Lead Location: 753860
Implantable Lead Model: 1948
Lead Channel Impedance Value: 380 Ohm
Lead Channel Impedance Value: 630 Ohm
Lead Channel Sensing Intrinsic Amplitude: 10.4 mV
Lead Channel Sensing Intrinsic Amplitude: 3 mV
Lead Channel Setting Pacing Amplitude: 2 V
Lead Channel Setting Pacing Pulse Width: 0.4 ms
Lead Channel Setting Sensing Sensitivity: 2 mV
MDC IDC LEAD IMPLANT DT: 20110128
MDC IDC LEAD LOCATION: 753859
MDC IDC MSMT BATTERY REMAINING LONGEVITY: 104 mo
MDC IDC MSMT BATTERY VOLTAGE: 2.93 V
MDC IDC PG SERIAL: 7096923
MDC IDC SESS DTM: 20161010060644
MDC IDC SET LEADCHNL RV PACING AMPLITUDE: 2.5 V
MDC IDC STAT BRADY AP VS PERCENT: 1 %
MDC IDC STAT BRADY RA PERCENT PACED: 1 %

## 2015-01-24 ENCOUNTER — Encounter: Payer: Self-pay | Admitting: Cardiology

## 2015-03-21 ENCOUNTER — Ambulatory Visit (INDEPENDENT_AMBULATORY_CARE_PROVIDER_SITE_OTHER): Payer: Medicare HMO | Admitting: Internal Medicine

## 2015-03-21 ENCOUNTER — Encounter: Payer: Self-pay | Admitting: Internal Medicine

## 2015-03-21 VITALS — BP 112/82 | HR 72 | Ht 60.0 in | Wt 98.0 lb

## 2015-03-21 DIAGNOSIS — I495 Sick sinus syndrome: Secondary | ICD-10-CM

## 2015-03-21 DIAGNOSIS — Z950 Presence of cardiac pacemaker: Secondary | ICD-10-CM

## 2015-03-21 LAB — CUP PACEART INCLINIC DEVICE CHECK
Brady Statistic RV Percent Paced: 0 %
Date Time Interrogation Session: 20170106093209
Implantable Lead Implant Date: 20110128
Implantable Lead Model: 1948
Lead Channel Impedance Value: 600 Ohm
Lead Channel Pacing Threshold Amplitude: 0.75 V
Lead Channel Pacing Threshold Amplitude: 0.75 V
Lead Channel Pacing Threshold Pulse Width: 0.4 ms
Lead Channel Pacing Threshold Pulse Width: 0.4 ms
Lead Channel Setting Pacing Amplitude: 2.5 V
Lead Channel Setting Sensing Sensitivity: 2 mV
MDC IDC LEAD IMPLANT DT: 20110128
MDC IDC LEAD LOCATION: 753859
MDC IDC LEAD LOCATION: 753860
MDC IDC MSMT BATTERY REMAINING LONGEVITY: 103.2
MDC IDC MSMT BATTERY VOLTAGE: 2.92 V
MDC IDC MSMT LEADCHNL RA IMPEDANCE VALUE: 362.5 Ohm
MDC IDC MSMT LEADCHNL RA PACING THRESHOLD AMPLITUDE: 0.75 V
MDC IDC MSMT LEADCHNL RA PACING THRESHOLD PULSEWIDTH: 0.4 ms
MDC IDC MSMT LEADCHNL RA PACING THRESHOLD PULSEWIDTH: 0.4 ms
MDC IDC MSMT LEADCHNL RA SENSING INTR AMPL: 3.9 mV
MDC IDC MSMT LEADCHNL RV PACING THRESHOLD AMPLITUDE: 0.75 V
MDC IDC MSMT LEADCHNL RV SENSING INTR AMPL: 9.1 mV
MDC IDC PG SERIAL: 7096923
MDC IDC SET LEADCHNL RA PACING AMPLITUDE: 2 V
MDC IDC SET LEADCHNL RV PACING PULSEWIDTH: 0.4 ms
MDC IDC STAT BRADY RA PERCENT PACED: 0.45 %
Pulse Gen Model: 2210

## 2015-03-21 NOTE — Progress Notes (Signed)
PCP: Gar Ponto, MD  Rhonda Hill is a 75 y.o. female who presents today for routine electrophysiology followup.  Since last being seen in our clinic, the patient reports doing very well.  Today, she denies symptoms of palpitations, chest pain, shortness of breath,  lower extremity edema, dizziness, presyncope, or syncope.  The patient is otherwise without complaint today.   Past Medical History  Diagnosis Date  . Sinoatrial node dysfunction (HCC)     with syncope, s/p PPM (SJM)  . Seizure disorder ([place])     followed by Dr Erling Cruz  . Paroxysmal supraventricular tachycardia ([place])   . Personal history of tobacco use, presenting hazards to health    Past Surgical History  Procedure Laterality Date  . Abdominal hysterectomy    . Pacemaker insertion  04/11/09    implanted by JA (SJM) for sick sinus    Current Outpatient Prescriptions  Medication Sig Dispense Refill  . carbamazepine (TEGRETOL) 200 MG tablet Take 600 mg by mouth 2 (two) times daily.      Marland Kitchen donepezil (ARICEPT) 5 MG tablet Take 5 mg by mouth at bedtime.    . levETIRAcetam (KEPPRA) 500 MG tablet Take 1,000 mg by mouth every 12 (twelve) hours.       No current facility-administered medications for this visit.    Physical Exam: Filed Vitals:   03/21/15 0909  BP: 112/82  Pulse: 72  Height: 5' (1.524 m)  Weight: 98 lb (44.453 kg)  SpO2: 99%    GEN- The patient is well appearing, alert and oriented x 3 today.   Head- normocephalic, atraumatic Eyes-  Sclera clear, conjunctiva pink Ears- hearing intact Oropharynx- clear Lungs- Clear to ausculation bilaterally, normal work of breathing Chest- pacemaker pocket is well healed Heart- Regular rate and rhythm, no murmurs, rubs or gallops, PMI not laterally displaced GI- soft, NT, ND, + BS Extremities- no clubbing, cyanosis, or edema  Pacemaker interrogation- reviewed in detail today,  See PACEART report  Assessment and Plan:  1. Sick sinus syndrome Normal  pacemaker function See Pace Art report No changes today  2. SVT Interrogation reveals only sinus tach She did have far field sensing on the atrial channel which causes inappropriate mode switching  Merlin Return in 1 year  Thompson Grayer MD, [facility] 03/21/2015 9:44 AM

## 2015-03-21 NOTE — Patient Instructions (Signed)
Your physician recommends that you continue on your current medications as directed. Please refer to the Current Medication list given to you today. Device check on 06/23/15. Your physician recommends that you schedule a follow-up appointment in: 1 year. You can schedule this appointment today or you can wait for your letter to come in the mail in about 10 months reminding you to call and schedule this appointment. If you do not receive this letter, please contact our office for your appointment.

## 2015-05-01 DIAGNOSIS — G40301 Generalized idiopathic epilepsy and epileptic syndromes, not intractable, with status epilepticus: Secondary | ICD-10-CM | POA: Diagnosis not present

## 2015-05-01 DIAGNOSIS — G301 Alzheimer's disease with late onset: Secondary | ICD-10-CM | POA: Diagnosis not present

## 2015-05-01 DIAGNOSIS — Z9189 Other specified personal risk factors, not elsewhere classified: Secondary | ICD-10-CM | POA: Diagnosis not present

## 2015-05-01 DIAGNOSIS — Z1389 Encounter for screening for other disorder: Secondary | ICD-10-CM | POA: Diagnosis not present

## 2015-05-07 DIAGNOSIS — D7289 Other specified disorders of white blood cells: Secondary | ICD-10-CM | POA: Diagnosis not present

## 2015-05-07 DIAGNOSIS — G301 Alzheimer's disease with late onset: Secondary | ICD-10-CM | POA: Diagnosis not present

## 2015-05-07 DIAGNOSIS — G40301 Generalized idiopathic epilepsy and epileptic syndromes, not intractable, with status epilepticus: Secondary | ICD-10-CM | POA: Diagnosis not present

## 2015-05-29 DIAGNOSIS — Z780 Asymptomatic menopausal state: Secondary | ICD-10-CM | POA: Diagnosis not present

## 2015-05-29 DIAGNOSIS — Z87891 Personal history of nicotine dependence: Secondary | ICD-10-CM | POA: Diagnosis not present

## 2015-05-29 DIAGNOSIS — M810 Age-related osteoporosis without current pathological fracture: Secondary | ICD-10-CM | POA: Diagnosis not present

## 2015-05-29 DIAGNOSIS — Z79899 Other long term (current) drug therapy: Secondary | ICD-10-CM | POA: Diagnosis not present

## 2015-05-29 DIAGNOSIS — R569 Unspecified convulsions: Secondary | ICD-10-CM | POA: Diagnosis not present

## 2015-05-29 DIAGNOSIS — Z90722 Acquired absence of ovaries, bilateral: Secondary | ICD-10-CM | POA: Diagnosis not present

## 2015-06-19 DIAGNOSIS — H35361 Drusen (degenerative) of macula, right eye: Secondary | ICD-10-CM | POA: Diagnosis not present

## 2015-06-23 ENCOUNTER — Ambulatory Visit (INDEPENDENT_AMBULATORY_CARE_PROVIDER_SITE_OTHER): Payer: Medicare HMO | Admitting: *Deleted

## 2015-06-23 DIAGNOSIS — I495 Sick sinus syndrome: Secondary | ICD-10-CM | POA: Diagnosis not present

## 2015-06-23 NOTE — Progress Notes (Signed)
Remote pacemaker transmission.   

## 2015-07-28 LAB — CUP PACEART REMOTE DEVICE CHECK
Battery Remaining Longevity: 92 mo
Battery Remaining Percentage: 73 %
Battery Voltage: 2.92 V
Brady Statistic AP VP Percent: 1 %
Brady Statistic AP VS Percent: 1 %
Brady Statistic AS VP Percent: 1 %
Brady Statistic AS VS Percent: 99 %
Brady Statistic RA Percent Paced: 1 %
Implantable Lead Implant Date: 20110128
Implantable Lead Implant Date: 20110128
Implantable Lead Location: 753859
Lead Channel Impedance Value: 580 Ohm
Lead Channel Sensing Intrinsic Amplitude: 2.6 mV
Lead Channel Setting Pacing Amplitude: 2.5 V
Lead Channel Setting Pacing Pulse Width: 0.4 ms
MDC IDC LEAD LOCATION: 753860
MDC IDC LEAD MODEL: 1948
MDC IDC MSMT LEADCHNL RA IMPEDANCE VALUE: 340 Ohm
MDC IDC MSMT LEADCHNL RV SENSING INTR AMPL: 8.6 mV
MDC IDC SESS DTM: 20170410071109
MDC IDC SET LEADCHNL RA PACING AMPLITUDE: 2 V
MDC IDC SET LEADCHNL RV SENSING SENSITIVITY: 2 mV
MDC IDC STAT BRADY RV PERCENT PACED: 1 %
Pulse Gen Serial Number: 7096923

## 2015-07-29 ENCOUNTER — Encounter: Payer: Self-pay | Admitting: Cardiology

## 2015-08-05 DIAGNOSIS — K5901 Slow transit constipation: Secondary | ICD-10-CM | POA: Diagnosis not present

## 2015-08-05 DIAGNOSIS — B029 Zoster without complications: Secondary | ICD-10-CM | POA: Diagnosis not present

## 2015-08-05 DIAGNOSIS — R51 Headache: Secondary | ICD-10-CM | POA: Diagnosis not present

## 2015-08-13 DIAGNOSIS — M545 Low back pain: Secondary | ICD-10-CM | POA: Diagnosis not present

## 2015-08-13 DIAGNOSIS — Z79899 Other long term (current) drug therapy: Secondary | ICD-10-CM | POA: Diagnosis not present

## 2015-08-13 DIAGNOSIS — S199XXA Unspecified injury of neck, initial encounter: Secondary | ICD-10-CM | POA: Diagnosis not present

## 2015-08-13 DIAGNOSIS — M542 Cervicalgia: Secondary | ICD-10-CM | POA: Diagnosis not present

## 2015-08-13 DIAGNOSIS — S3992XA Unspecified injury of lower back, initial encounter: Secondary | ICD-10-CM | POA: Diagnosis not present

## 2015-08-14 DIAGNOSIS — T07 Unspecified multiple injuries: Secondary | ICD-10-CM | POA: Diagnosis not present

## 2015-08-21 DIAGNOSIS — Z1211 Encounter for screening for malignant neoplasm of colon: Secondary | ICD-10-CM | POA: Diagnosis not present

## 2015-08-21 DIAGNOSIS — Z79899 Other long term (current) drug therapy: Secondary | ICD-10-CM | POA: Diagnosis not present

## 2015-08-21 DIAGNOSIS — Z8249 Family history of ischemic heart disease and other diseases of the circulatory system: Secondary | ICD-10-CM | POA: Diagnosis not present

## 2015-08-21 DIAGNOSIS — K5730 Diverticulosis of large intestine without perforation or abscess without bleeding: Secondary | ICD-10-CM | POA: Diagnosis not present

## 2015-08-21 DIAGNOSIS — K635 Polyp of colon: Secondary | ICD-10-CM | POA: Diagnosis not present

## 2015-08-21 DIAGNOSIS — D127 Benign neoplasm of rectosigmoid junction: Secondary | ICD-10-CM | POA: Diagnosis not present

## 2015-08-21 DIAGNOSIS — Z90710 Acquired absence of both cervix and uterus: Secondary | ICD-10-CM | POA: Diagnosis not present

## 2015-08-21 DIAGNOSIS — Z950 Presence of cardiac pacemaker: Secondary | ICD-10-CM | POA: Diagnosis not present

## 2015-08-21 DIAGNOSIS — D125 Benign neoplasm of sigmoid colon: Secondary | ICD-10-CM | POA: Diagnosis not present

## 2015-08-21 DIAGNOSIS — K641 Second degree hemorrhoids: Secondary | ICD-10-CM | POA: Diagnosis not present

## 2015-09-22 ENCOUNTER — Ambulatory Visit (INDEPENDENT_AMBULATORY_CARE_PROVIDER_SITE_OTHER): Payer: Medicare HMO | Admitting: *Deleted

## 2015-09-22 DIAGNOSIS — I495 Sick sinus syndrome: Secondary | ICD-10-CM

## 2015-09-22 NOTE — Progress Notes (Signed)
Remote pacemaker transmission.   

## 2015-09-24 ENCOUNTER — Encounter: Payer: Self-pay | Admitting: Cardiology

## 2015-09-24 LAB — CUP PACEART REMOTE DEVICE CHECK
Battery Voltage: 2.92 V
Brady Statistic AP VP Percent: 1 %
Brady Statistic AS VP Percent: 1 %
Brady Statistic RA Percent Paced: 1 %
Brady Statistic RV Percent Paced: 1 %
Implantable Lead Implant Date: 20110128
Implantable Lead Location: 753859
Implantable Lead Location: 753860
Lead Channel Impedance Value: 340 Ohm
Lead Channel Sensing Intrinsic Amplitude: 3.6 mV
Lead Channel Setting Pacing Amplitude: 2.5 V
MDC IDC LEAD IMPLANT DT: 20110128
MDC IDC LEAD MODEL: 1948
MDC IDC MSMT BATTERY REMAINING LONGEVITY: 92 mo
MDC IDC MSMT BATTERY REMAINING PERCENTAGE: 73 %
MDC IDC MSMT LEADCHNL RV IMPEDANCE VALUE: 600 Ohm
MDC IDC MSMT LEADCHNL RV SENSING INTR AMPL: 11.7 mV
MDC IDC SESS DTM: 20170710062049
MDC IDC SET LEADCHNL RA PACING AMPLITUDE: 2 V
MDC IDC SET LEADCHNL RV PACING PULSEWIDTH: 0.4 ms
MDC IDC SET LEADCHNL RV SENSING SENSITIVITY: 2 mV
MDC IDC STAT BRADY AP VS PERCENT: 1 %
MDC IDC STAT BRADY AS VS PERCENT: 99 %
Pulse Gen Serial Number: 7096923

## 2015-12-19 DIAGNOSIS — Z1231 Encounter for screening mammogram for malignant neoplasm of breast: Secondary | ICD-10-CM | POA: Diagnosis not present

## 2015-12-22 ENCOUNTER — Ambulatory Visit (INDEPENDENT_AMBULATORY_CARE_PROVIDER_SITE_OTHER): Payer: Medicare HMO | Admitting: *Deleted

## 2015-12-22 DIAGNOSIS — I495 Sick sinus syndrome: Secondary | ICD-10-CM

## 2015-12-22 NOTE — Progress Notes (Signed)
Remote pacemaker transmission.   

## 2015-12-23 ENCOUNTER — Encounter: Payer: Self-pay | Admitting: Cardiology

## 2016-01-09 DIAGNOSIS — G40301 Generalized idiopathic epilepsy and epileptic syndromes, not intractable, with status epilepticus: Secondary | ICD-10-CM | POA: Diagnosis not present

## 2016-01-09 DIAGNOSIS — Z9189 Other specified personal risk factors, not elsewhere classified: Secondary | ICD-10-CM | POA: Diagnosis not present

## 2016-01-09 DIAGNOSIS — Z23 Encounter for immunization: Secondary | ICD-10-CM | POA: Diagnosis not present

## 2016-01-09 DIAGNOSIS — M810 Age-related osteoporosis without current pathological fracture: Secondary | ICD-10-CM | POA: Diagnosis not present

## 2016-01-09 DIAGNOSIS — Z6820 Body mass index (BMI) 20.0-20.9, adult: Secondary | ICD-10-CM | POA: Diagnosis not present

## 2016-01-09 DIAGNOSIS — G301 Alzheimer's disease with late onset: Secondary | ICD-10-CM | POA: Diagnosis not present

## 2016-01-20 LAB — CUP PACEART REMOTE DEVICE CHECK
Battery Remaining Longevity: 82 mo
Brady Statistic AS VP Percent: 1 %
Brady Statistic RA Percent Paced: 1 %
Brady Statistic RV Percent Paced: 1 %
Date Time Interrogation Session: 20171009060008
Implantable Lead Implant Date: 20110128
Implantable Lead Location: 753859
Implantable Lead Location: 753860
Lead Channel Impedance Value: 340 Ohm
Lead Channel Pacing Threshold Pulse Width: 0.4 ms
Lead Channel Sensing Intrinsic Amplitude: 9.9 mV
Lead Channel Setting Pacing Amplitude: 2 V
Lead Channel Setting Pacing Pulse Width: 0.4 ms
Lead Channel Setting Sensing Sensitivity: 2 mV
MDC IDC LEAD IMPLANT DT: 20110128
MDC IDC LEAD MODEL: 1948
MDC IDC MSMT BATTERY REMAINING PERCENTAGE: 65 %
MDC IDC MSMT BATTERY VOLTAGE: 2.9 V
MDC IDC MSMT LEADCHNL RA PACING THRESHOLD AMPLITUDE: 0.75 V
MDC IDC MSMT LEADCHNL RA SENSING INTR AMPL: 2.8 mV
MDC IDC MSMT LEADCHNL RV IMPEDANCE VALUE: 580 Ohm
MDC IDC MSMT LEADCHNL RV PACING THRESHOLD AMPLITUDE: 0.75 V
MDC IDC MSMT LEADCHNL RV PACING THRESHOLD PULSEWIDTH: 0.4 ms
MDC IDC PG IMPLANT DT: 20110128
MDC IDC SET LEADCHNL RV PACING AMPLITUDE: 2.5 V
MDC IDC STAT BRADY AP VP PERCENT: 1 %
MDC IDC STAT BRADY AP VS PERCENT: 1 %
MDC IDC STAT BRADY AS VS PERCENT: 99 %
Pulse Gen Model: 2210
Pulse Gen Serial Number: 7096923

## 2016-01-22 DIAGNOSIS — Z79899 Other long term (current) drug therapy: Secondary | ICD-10-CM | POA: Diagnosis not present

## 2016-01-22 DIAGNOSIS — L509 Urticaria, unspecified: Secondary | ICD-10-CM | POA: Diagnosis not present

## 2016-01-22 DIAGNOSIS — R569 Unspecified convulsions: Secondary | ICD-10-CM | POA: Diagnosis not present

## 2016-01-22 DIAGNOSIS — Z87891 Personal history of nicotine dependence: Secondary | ICD-10-CM | POA: Diagnosis not present

## 2016-03-19 ENCOUNTER — Ambulatory Visit (INDEPENDENT_AMBULATORY_CARE_PROVIDER_SITE_OTHER): Payer: Medicare HMO | Admitting: Internal Medicine

## 2016-03-19 ENCOUNTER — Encounter: Payer: Self-pay | Admitting: Internal Medicine

## 2016-03-19 VITALS — BP 138/83 | HR 75 | Ht 60.0 in | Wt 103.0 lb

## 2016-03-19 DIAGNOSIS — Z950 Presence of cardiac pacemaker: Secondary | ICD-10-CM | POA: Diagnosis not present

## 2016-03-19 DIAGNOSIS — I495 Sick sinus syndrome: Secondary | ICD-10-CM

## 2016-03-19 NOTE — Patient Instructions (Signed)
Medication Instructions:  Continue all current medications.  Labwork: none  Testing/Procedures: none  Follow-Up: Your physician wants you to follow up in:  1 year.  You will receive a reminder letter in the mail one-two months in advance.  If you don't receive a letter, please call our office to schedule the follow up appointment   Any Other Special Instructions Will Be Listed Below (If Applicable). Remote monitoring is used to monitor your Pacemaker of ICD from home. This monitoring reduces the number of office visits required to check your device to one time per year. It allows us to keep an eye on the functioning of your device to ensure it is working properly. You are scheduled for a device check from home on 06/21/2016.  You may send your transmission at any time that day. If you have a wireless device, the transmission will be sent automatically. After your physician reviews your transmission, you will receive a postcard with your next transmission date.  If you need a refill on your cardiac medications before your next appointment, please call your pharmacy.  

## 2016-03-19 NOTE — Progress Notes (Signed)
   PCP: Gar Ponto, MD  Rhonda Hill is a 76 y.o. female who presents today for routine electrophysiology followup.  Since last being seen in our clinic, the patient reports doing very well.  Today, she denies symptoms of palpitations, chest pain, shortness of breath,  lower extremity edema, dizziness, presyncope, or syncope.  The patient is otherwise without complaint today.   Past Medical History:  Diagnosis Date  . Paroxysmal supraventricular tachycardia ([place])   . Personal history of tobacco use, presenting hazards to health   . Seizure disorder ([place])    followed by Dr Erling Cruz  . Sinoatrial node dysfunction (HCC)    with syncope, s/p PPM (SJM)   Past Surgical History:  Procedure Laterality Date  . ABDOMINAL HYSTERECTOMY    . PACEMAKER INSERTION  04/11/09   implanted by JA (SJM) for sick sinus    Current Outpatient Prescriptions  Medication Sig Dispense Refill  . alendronate (FOSAMAX) 70 MG tablet Take 1 tablet by mouth once a week.  3  . carbamazepine (TEGRETOL) 200 MG tablet Take 600 mg by mouth 2 (two) times daily.      Marland Kitchen donepezil (ARICEPT) 10 MG tablet Take 1 tablet by mouth daily.  1  . levETIRAcetam (KEPPRA) 500 MG tablet Take 1,000 mg by mouth every 12 (twelve) hours.      . polyethylene glycol powder (GLYCOLAX/MIRALAX) powder Take 17 g by mouth daily as needed.  6   No current facility-administered medications for this visit.     Physical Exam: Vitals:   03/19/16 0947  BP: 138/83  Pulse: 75  SpO2: 100%  Weight: 103 lb (46.7 kg)  Height: 5' (1.524 m)    GEN- The patient is well appearing, alert and oriented x 3 today.   Head- normocephalic, atraumatic Eyes-  Sclera clear, conjunctiva pink Ears- hearing intact Oropharynx- clear Lungs- Clear to ausculation bilaterally, normal work of breathing Chest- pacemaker pocket is well healed Heart- Regular rate and rhythm, no murmurs, rubs or gallops, PMI not laterally displaced GI- soft, NT, ND, + BS Extremities-  no clubbing, cyanosis, or edema  Pacemaker interrogation- reviewed in detail today,  See PACEART report  Assessment and Plan:  1. Sick sinus syndrome Normal pacemaker function See Pace Art report No changes today  2. SVT Interrogation reveals only sinus tach  Merlin Return in 1 year  Thompson Grayer MD, [facility] 03/19/2016 10:33 AM

## 2016-03-22 LAB — CUP PACEART INCLINIC DEVICE CHECK
Battery Voltage: 2.89 V
Brady Statistic RV Percent Paced: 0 %
Implantable Lead Implant Date: 20110128
Implantable Lead Location: 753859
Implantable Lead Model: 1948
Implantable Pulse Generator Implant Date: 20110128
Lead Channel Impedance Value: 600 Ohm
Lead Channel Pacing Threshold Amplitude: 0.75 V
Lead Channel Pacing Threshold Pulse Width: 0.4 ms
Lead Channel Pacing Threshold Pulse Width: 0.4 ms
Lead Channel Pacing Threshold Pulse Width: 0.4 ms
Lead Channel Sensing Intrinsic Amplitude: 10 mV
Lead Channel Setting Pacing Amplitude: 2 V
Lead Channel Setting Pacing Amplitude: 2.5 V
Lead Channel Setting Pacing Pulse Width: 0.4 ms
Lead Channel Setting Sensing Sensitivity: 2 mV
MDC IDC LEAD IMPLANT DT: 20110128
MDC IDC LEAD LOCATION: 753860
MDC IDC MSMT LEADCHNL RA IMPEDANCE VALUE: 362.5 Ohm
MDC IDC MSMT LEADCHNL RA PACING THRESHOLD AMPLITUDE: 0.75 V
MDC IDC MSMT LEADCHNL RA PACING THRESHOLD AMPLITUDE: 0.75 V
MDC IDC MSMT LEADCHNL RA PACING THRESHOLD PULSEWIDTH: 0.4 ms
MDC IDC MSMT LEADCHNL RA SENSING INTR AMPL: 2.6 mV
MDC IDC MSMT LEADCHNL RV PACING THRESHOLD AMPLITUDE: 0.75 V
MDC IDC SESS DTM: 20180105160054
MDC IDC STAT BRADY RA PERCENT PACED: 0.12 %
Pulse Gen Model: 2210
Pulse Gen Serial Number: 7096923

## 2016-03-25 DIAGNOSIS — H35363 Drusen (degenerative) of macula, bilateral: Secondary | ICD-10-CM | POA: Diagnosis not present

## 2016-05-14 DIAGNOSIS — M810 Age-related osteoporosis without current pathological fracture: Secondary | ICD-10-CM | POA: Diagnosis not present

## 2016-05-14 DIAGNOSIS — Z9189 Other specified personal risk factors, not elsewhere classified: Secondary | ICD-10-CM | POA: Diagnosis not present

## 2016-05-14 DIAGNOSIS — D7289 Other specified disorders of white blood cells: Secondary | ICD-10-CM | POA: Diagnosis not present

## 2016-05-14 DIAGNOSIS — G40301 Generalized idiopathic epilepsy and epileptic syndromes, not intractable, with status epilepticus: Secondary | ICD-10-CM | POA: Diagnosis not present

## 2016-05-14 DIAGNOSIS — G301 Alzheimer's disease with late onset: Secondary | ICD-10-CM | POA: Diagnosis not present

## 2016-05-19 DIAGNOSIS — Z6820 Body mass index (BMI) 20.0-20.9, adult: Secondary | ICD-10-CM | POA: Diagnosis not present

## 2016-05-19 DIAGNOSIS — G301 Alzheimer's disease with late onset: Secondary | ICD-10-CM | POA: Diagnosis not present

## 2016-05-19 DIAGNOSIS — M810 Age-related osteoporosis without current pathological fracture: Secondary | ICD-10-CM | POA: Diagnosis not present

## 2016-05-19 DIAGNOSIS — Z0001 Encounter for general adult medical examination with abnormal findings: Secondary | ICD-10-CM | POA: Diagnosis not present

## 2016-05-19 DIAGNOSIS — G40301 Generalized idiopathic epilepsy and epileptic syndromes, not intractable, with status epilepticus: Secondary | ICD-10-CM | POA: Diagnosis not present

## 2016-05-19 DIAGNOSIS — Z9189 Other specified personal risk factors, not elsewhere classified: Secondary | ICD-10-CM | POA: Diagnosis not present

## 2016-06-21 ENCOUNTER — Ambulatory Visit (INDEPENDENT_AMBULATORY_CARE_PROVIDER_SITE_OTHER): Payer: Medicare HMO | Admitting: *Deleted

## 2016-06-21 DIAGNOSIS — I495 Sick sinus syndrome: Secondary | ICD-10-CM | POA: Diagnosis not present

## 2016-06-21 NOTE — Progress Notes (Signed)
Remote pacemaker transmission.   

## 2016-06-23 ENCOUNTER — Encounter: Payer: Self-pay | Admitting: Cardiology

## 2016-06-25 LAB — CUP PACEART REMOTE DEVICE CHECK
Battery Remaining Longevity: 73 mo
Battery Remaining Percentage: 57 %
Brady Statistic AP VS Percent: 1 %
Brady Statistic AS VP Percent: 1 %
Brady Statistic RA Percent Paced: 1 %
Date Time Interrogation Session: 20180409060007
Implantable Lead Implant Date: 20110128
Implantable Lead Location: 753860
Implantable Lead Model: 1948
Lead Channel Impedance Value: 360 Ohm
Lead Channel Pacing Threshold Pulse Width: 0.4 ms
Lead Channel Sensing Intrinsic Amplitude: 2.9 mV
Lead Channel Sensing Intrinsic Amplitude: 9.5 mV
MDC IDC LEAD IMPLANT DT: 20110128
MDC IDC LEAD LOCATION: 753859
MDC IDC MSMT BATTERY VOLTAGE: 2.89 V
MDC IDC MSMT LEADCHNL RA PACING THRESHOLD AMPLITUDE: 0.75 V
MDC IDC MSMT LEADCHNL RV IMPEDANCE VALUE: 600 Ohm
MDC IDC MSMT LEADCHNL RV PACING THRESHOLD AMPLITUDE: 0.75 V
MDC IDC MSMT LEADCHNL RV PACING THRESHOLD PULSEWIDTH: 0.4 ms
MDC IDC PG IMPLANT DT: 20110128
MDC IDC SET LEADCHNL RA PACING AMPLITUDE: 2 V
MDC IDC SET LEADCHNL RV PACING AMPLITUDE: 2.5 V
MDC IDC SET LEADCHNL RV PACING PULSEWIDTH: 0.4 ms
MDC IDC SET LEADCHNL RV SENSING SENSITIVITY: 2 mV
MDC IDC STAT BRADY AP VP PERCENT: 1 %
MDC IDC STAT BRADY AS VS PERCENT: 99 %
MDC IDC STAT BRADY RV PERCENT PACED: 1 %
Pulse Gen Model: 2210
Pulse Gen Serial Number: 7096923

## 2016-09-20 ENCOUNTER — Ambulatory Visit (INDEPENDENT_AMBULATORY_CARE_PROVIDER_SITE_OTHER): Payer: Medicare HMO | Admitting: *Deleted

## 2016-09-20 DIAGNOSIS — I495 Sick sinus syndrome: Secondary | ICD-10-CM

## 2016-09-21 NOTE — Progress Notes (Signed)
Remote pacemaker transmission.   

## 2016-09-22 LAB — CUP PACEART REMOTE DEVICE CHECK
Battery Remaining Longevity: 73 mo
Battery Remaining Percentage: 57 %
Battery Voltage: 2.89 V
Brady Statistic RA Percent Paced: 1 %
Date Time Interrogation Session: 20180709062806
Implantable Lead Implant Date: 20110128
Implantable Lead Location: 753860
Implantable Lead Model: 1948
Implantable Pulse Generator Implant Date: 20110128
Lead Channel Impedance Value: 360 Ohm
Lead Channel Pacing Threshold Amplitude: 0.75 V
Lead Channel Pacing Threshold Pulse Width: 0.4 ms
Lead Channel Setting Pacing Amplitude: 2 V
MDC IDC LEAD IMPLANT DT: 20110128
MDC IDC LEAD LOCATION: 753859
MDC IDC MSMT LEADCHNL RA SENSING INTR AMPL: 1.9 mV
MDC IDC MSMT LEADCHNL RV IMPEDANCE VALUE: 560 Ohm
MDC IDC MSMT LEADCHNL RV PACING THRESHOLD AMPLITUDE: 0.75 V
MDC IDC MSMT LEADCHNL RV PACING THRESHOLD PULSEWIDTH: 0.4 ms
MDC IDC MSMT LEADCHNL RV SENSING INTR AMPL: 9.1 mV
MDC IDC SET LEADCHNL RV PACING AMPLITUDE: 2.5 V
MDC IDC SET LEADCHNL RV PACING PULSEWIDTH: 0.4 ms
MDC IDC SET LEADCHNL RV SENSING SENSITIVITY: 2 mV
MDC IDC STAT BRADY AP VP PERCENT: 1 %
MDC IDC STAT BRADY AP VS PERCENT: 1 %
MDC IDC STAT BRADY AS VP PERCENT: 1 %
MDC IDC STAT BRADY AS VS PERCENT: 99 %
MDC IDC STAT BRADY RV PERCENT PACED: 1 %
Pulse Gen Model: 2210
Pulse Gen Serial Number: 7096923

## 2016-09-27 ENCOUNTER — Encounter: Payer: Self-pay | Admitting: Cardiology

## 2016-10-28 DIAGNOSIS — R030 Elevated blood-pressure reading, without diagnosis of hypertension: Secondary | ICD-10-CM | POA: Diagnosis not present

## 2016-10-28 DIAGNOSIS — Z681 Body mass index (BMI) 19 or less, adult: Secondary | ICD-10-CM | POA: Diagnosis not present

## 2016-11-19 DIAGNOSIS — G301 Alzheimer's disease with late onset: Secondary | ICD-10-CM | POA: Diagnosis not present

## 2016-11-19 DIAGNOSIS — Z1389 Encounter for screening for other disorder: Secondary | ICD-10-CM | POA: Diagnosis not present

## 2016-11-19 DIAGNOSIS — Z681 Body mass index (BMI) 19 or less, adult: Secondary | ICD-10-CM | POA: Diagnosis not present

## 2016-11-19 DIAGNOSIS — G40301 Generalized idiopathic epilepsy and epileptic syndromes, not intractable, with status epilepticus: Secondary | ICD-10-CM | POA: Diagnosis not present

## 2016-11-19 DIAGNOSIS — K5901 Slow transit constipation: Secondary | ICD-10-CM | POA: Diagnosis not present

## 2016-11-19 DIAGNOSIS — Z23 Encounter for immunization: Secondary | ICD-10-CM | POA: Diagnosis not present

## 2016-11-19 DIAGNOSIS — M810 Age-related osteoporosis without current pathological fracture: Secondary | ICD-10-CM | POA: Diagnosis not present

## 2016-12-20 ENCOUNTER — Ambulatory Visit (INDEPENDENT_AMBULATORY_CARE_PROVIDER_SITE_OTHER): Payer: Medicare HMO | Admitting: *Deleted

## 2016-12-20 DIAGNOSIS — I495 Sick sinus syndrome: Secondary | ICD-10-CM

## 2016-12-21 NOTE — Progress Notes (Signed)
Remote pacemaker transmission.   

## 2016-12-24 ENCOUNTER — Encounter: Payer: Self-pay | Admitting: Cardiology

## 2017-01-05 DIAGNOSIS — H40013 Open angle with borderline findings, low risk, bilateral: Secondary | ICD-10-CM | POA: Diagnosis not present

## 2017-01-10 LAB — CUP PACEART REMOTE DEVICE CHECK
Brady Statistic AP VP Percent: 1 %
Brady Statistic AS VP Percent: 1 %
Brady Statistic RA Percent Paced: 1 %
Date Time Interrogation Session: 20181008070220
Implantable Lead Location: 753859
Implantable Lead Location: 753860
Implantable Pulse Generator Implant Date: 20110128
Lead Channel Impedance Value: 350 Ohm
Lead Channel Pacing Threshold Amplitude: 0.75 V
Lead Channel Pacing Threshold Pulse Width: 0.4 ms
Lead Channel Sensing Intrinsic Amplitude: 2.7 mV
Lead Channel Setting Pacing Amplitude: 2 V
Lead Channel Setting Pacing Pulse Width: 0.4 ms
Lead Channel Setting Sensing Sensitivity: 2 mV
MDC IDC LEAD IMPLANT DT: 20110128
MDC IDC LEAD IMPLANT DT: 20110128
MDC IDC MSMT BATTERY REMAINING LONGEVITY: 65 mo
MDC IDC MSMT BATTERY REMAINING PERCENTAGE: 51 %
MDC IDC MSMT BATTERY VOLTAGE: 2.87 V
MDC IDC MSMT LEADCHNL RA PACING THRESHOLD AMPLITUDE: 0.75 V
MDC IDC MSMT LEADCHNL RV IMPEDANCE VALUE: 580 Ohm
MDC IDC MSMT LEADCHNL RV PACING THRESHOLD PULSEWIDTH: 0.4 ms
MDC IDC MSMT LEADCHNL RV SENSING INTR AMPL: 10.2 mV
MDC IDC SET LEADCHNL RV PACING AMPLITUDE: 2.5 V
MDC IDC STAT BRADY AP VS PERCENT: 1 %
MDC IDC STAT BRADY AS VS PERCENT: 99 %
MDC IDC STAT BRADY RV PERCENT PACED: 1 %
Pulse Gen Model: 2210
Pulse Gen Serial Number: 7096923

## 2017-01-21 DIAGNOSIS — Z1231 Encounter for screening mammogram for malignant neoplasm of breast: Secondary | ICD-10-CM | POA: Diagnosis not present

## 2017-03-16 DIAGNOSIS — H11133 Conjunctival pigmentations, bilateral: Secondary | ICD-10-CM | POA: Diagnosis not present

## 2017-03-16 DIAGNOSIS — H18413 Arcus senilis, bilateral: Secondary | ICD-10-CM | POA: Diagnosis not present

## 2017-03-16 DIAGNOSIS — Z961 Presence of intraocular lens: Secondary | ICD-10-CM | POA: Diagnosis not present

## 2017-03-16 DIAGNOSIS — H26493 Other secondary cataract, bilateral: Secondary | ICD-10-CM | POA: Diagnosis not present

## 2017-03-16 DIAGNOSIS — H11153 Pinguecula, bilateral: Secondary | ICD-10-CM | POA: Diagnosis not present

## 2017-03-16 DIAGNOSIS — H40013 Open angle with borderline findings, low risk, bilateral: Secondary | ICD-10-CM | POA: Diagnosis not present

## 2017-03-17 DIAGNOSIS — R030 Elevated blood-pressure reading, without diagnosis of hypertension: Secondary | ICD-10-CM | POA: Diagnosis not present

## 2017-03-17 DIAGNOSIS — G301 Alzheimer's disease with late onset: Secondary | ICD-10-CM | POA: Diagnosis not present

## 2017-03-17 DIAGNOSIS — E782 Mixed hyperlipidemia: Secondary | ICD-10-CM | POA: Diagnosis not present

## 2017-03-17 DIAGNOSIS — G40301 Generalized idiopathic epilepsy and epileptic syndromes, not intractable, with status epilepticus: Secondary | ICD-10-CM | POA: Diagnosis not present

## 2017-03-17 DIAGNOSIS — K5901 Slow transit constipation: Secondary | ICD-10-CM | POA: Diagnosis not present

## 2017-03-17 DIAGNOSIS — D7289 Other specified disorders of white blood cells: Secondary | ICD-10-CM | POA: Diagnosis not present

## 2017-03-17 DIAGNOSIS — M810 Age-related osteoporosis without current pathological fracture: Secondary | ICD-10-CM | POA: Diagnosis not present

## 2017-03-17 DIAGNOSIS — Z9189 Other specified personal risk factors, not elsewhere classified: Secondary | ICD-10-CM | POA: Diagnosis not present

## 2017-03-21 ENCOUNTER — Ambulatory Visit (INDEPENDENT_AMBULATORY_CARE_PROVIDER_SITE_OTHER): Payer: Medicare HMO | Admitting: *Deleted

## 2017-03-21 DIAGNOSIS — I495 Sick sinus syndrome: Secondary | ICD-10-CM

## 2017-03-21 NOTE — Progress Notes (Signed)
Remote pacemaker transmission.   

## 2017-03-22 ENCOUNTER — Encounter: Payer: Self-pay | Admitting: Cardiology

## 2017-03-28 LAB — CUP PACEART REMOTE DEVICE CHECK
Battery Remaining Longevity: 65 mo
Battery Remaining Percentage: 51 %
Battery Voltage: 2.87 V
Brady Statistic AS VS Percent: 99 %
Brady Statistic RA Percent Paced: 1 %
Date Time Interrogation Session: 20190107073107
Implantable Lead Implant Date: 20110128
Implantable Lead Implant Date: 20110128
Implantable Lead Location: 753859
Implantable Lead Location: 753860
Implantable Lead Model: 1948
Implantable Pulse Generator Implant Date: 20110128
Lead Channel Pacing Threshold Amplitude: 0.75 V
Lead Channel Pacing Threshold Pulse Width: 0.4 ms
Lead Channel Sensing Intrinsic Amplitude: 7 mV
Lead Channel Setting Pacing Amplitude: 2.5 V
Lead Channel Setting Pacing Pulse Width: 0.4 ms
Lead Channel Setting Sensing Sensitivity: 2 mV
MDC IDC MSMT LEADCHNL RA IMPEDANCE VALUE: 360 Ohm
MDC IDC MSMT LEADCHNL RA SENSING INTR AMPL: 3.1 mV
MDC IDC MSMT LEADCHNL RV IMPEDANCE VALUE: 590 Ohm
MDC IDC MSMT LEADCHNL RV PACING THRESHOLD AMPLITUDE: 0.75 V
MDC IDC MSMT LEADCHNL RV PACING THRESHOLD PULSEWIDTH: 0.4 ms
MDC IDC SET LEADCHNL RA PACING AMPLITUDE: 2 V
MDC IDC STAT BRADY AP VP PERCENT: 1 %
MDC IDC STAT BRADY AP VS PERCENT: 1 %
MDC IDC STAT BRADY AS VP PERCENT: 1 %
MDC IDC STAT BRADY RV PERCENT PACED: 1 %
Pulse Gen Model: 2210
Pulse Gen Serial Number: 7096923

## 2017-04-22 DIAGNOSIS — Z6820 Body mass index (BMI) 20.0-20.9, adult: Secondary | ICD-10-CM | POA: Diagnosis not present

## 2017-04-22 DIAGNOSIS — K5901 Slow transit constipation: Secondary | ICD-10-CM | POA: Diagnosis not present

## 2017-04-22 DIAGNOSIS — M810 Age-related osteoporosis without current pathological fracture: Secondary | ICD-10-CM | POA: Diagnosis not present

## 2017-04-22 DIAGNOSIS — G301 Alzheimer's disease with late onset: Secondary | ICD-10-CM | POA: Diagnosis not present

## 2017-04-22 DIAGNOSIS — G40301 Generalized idiopathic epilepsy and epileptic syndromes, not intractable, with status epilepticus: Secondary | ICD-10-CM | POA: Diagnosis not present

## 2017-04-22 DIAGNOSIS — Z9189 Other specified personal risk factors, not elsewhere classified: Secondary | ICD-10-CM | POA: Diagnosis not present

## 2017-06-16 DIAGNOSIS — G301 Alzheimer's disease with late onset: Secondary | ICD-10-CM | POA: Diagnosis not present

## 2017-06-16 DIAGNOSIS — Z9189 Other specified personal risk factors, not elsewhere classified: Secondary | ICD-10-CM | POA: Diagnosis not present

## 2017-06-16 DIAGNOSIS — E782 Mixed hyperlipidemia: Secondary | ICD-10-CM | POA: Diagnosis not present

## 2017-06-16 DIAGNOSIS — G40301 Generalized idiopathic epilepsy and epileptic syndromes, not intractable, with status epilepticus: Secondary | ICD-10-CM | POA: Diagnosis not present

## 2017-06-16 DIAGNOSIS — D7289 Other specified disorders of white blood cells: Secondary | ICD-10-CM | POA: Diagnosis not present

## 2017-06-16 DIAGNOSIS — R5383 Other fatigue: Secondary | ICD-10-CM | POA: Diagnosis not present

## 2017-06-16 DIAGNOSIS — Z13220 Encounter for screening for lipoid disorders: Secondary | ICD-10-CM | POA: Diagnosis not present

## 2017-06-17 ENCOUNTER — Encounter: Payer: Self-pay | Admitting: Internal Medicine

## 2017-06-17 ENCOUNTER — Ambulatory Visit: Payer: Medicare HMO | Admitting: Internal Medicine

## 2017-06-17 VITALS — BP 160/81 | HR 67 | Ht 60.0 in | Wt 101.6 lb

## 2017-06-17 DIAGNOSIS — I10 Essential (primary) hypertension: Secondary | ICD-10-CM

## 2017-06-17 DIAGNOSIS — I495 Sick sinus syndrome: Secondary | ICD-10-CM | POA: Diagnosis not present

## 2017-06-17 DIAGNOSIS — I471 Supraventricular tachycardia: Secondary | ICD-10-CM | POA: Diagnosis not present

## 2017-06-17 MED ORDER — AMLODIPINE BESYLATE 2.5 MG PO TABS: 2.5000 mg | ORAL_TABLET | Freq: Every day | ORAL | 1 refills | Status: DC

## 2017-06-17 NOTE — Patient Instructions (Signed)
Your physician wants you to follow-up in: [place] will receive a reminder letter in the mail two months in advance. If you don't receive a letter, please call our office to schedule the follow-up appointment.  Your physician has recommended you make the following change in your medication:   START AMLODIPINE 2.5 MG DAILY  Remote monitoring is used to monitor your Pacemaker of ICD from home. This monitoring reduces the number of office visits required to check your device to one time per year. It allows [place] to keep an eye on the functioning of your device to ensure it is working properly. You are scheduled for a device check from home on 06/20/17. You may send your transmission at any time that day. If you have a wireless device, the transmission will be sent automatically. After your physician reviews your transmission, you will receive a postcard with your next transmission date.

## 2017-06-17 NOTE — Progress Notes (Signed)
    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 female who presents today for routine electrophysiology followup.  Since last being seen in our clinic, the patient reports doing very well.  Today, she denies symptoms of palpitations, chest pain, shortness of breath,  lower extremity edema, dizziness, presyncope, or syncope.  The patient is otherwise without complaint today.   Past Medical History:  Diagnosis Date  . Paroxysmal supraventricular tachycardia ([place])   . Personal history of tobacco use, presenting hazards to health   . Seizure disorder ([place])    followed by Dr Erling Cruz  . Sinoatrial node dysfunction (HCC)    with syncope, s/p PPM (SJM)    ROS- all systems are reviewed and negative except as per HPI above  Current Outpatient Medications  Medication Sig Dispense Refill  . alendronate (FOSAMAX) 70 MG tablet Take 1 tablet by mouth once a week.  3  . carbamazepine (TEGRETOL) 200 MG tablet Take 600 mg by mouth 2 (two) times daily.      Marland Kitchen donepezil (ARICEPT) 10 MG tablet Take 1 tablet by mouth daily.  1  . levETIRAcetam (KEPPRA) 500 MG tablet Take 1,000 mg by mouth every 12 (twelve) hours.      . polyethylene glycol powder (GLYCOLAX/MIRALAX) powder Take 17 g by mouth daily as needed.  6   No current facility-administered medications for this visit.     Physical Exam: Vitals:   06/17/17 0923  BP: (!) 160/81  Pulse: 67  Weight: 101 lb 9.6 oz (46.1 kg)  Height: 5' (1.524 m)    GEN- The patient is well appearing, alert and oriented x 3 today.   Head- normocephalic, atraumatic Eyes-  Sclera clear, conjunctiva pink Ears- hearing intact Oropharynx- clear Lungs- Clear to ausculation bilaterally, normal work of breathing Chest- pacemaker pocket is well healed Heart- Regular rate and rhythm, no murmurs, rubs or gallops, PMI not laterally displaced GI- soft, NT, ND, + BS Extremities- no clubbing, cyanosis, or edema  Pacemaker interrogation-  reviewed in detail today,  See PACEART report   Assessment and Plan:  1. Symptomatic sinus bradycardia  Normal pacemaker function See Pace Art report No changes today  2. SVT Well controlled 1:1 tachycardia x 1 minute only in the past year  3. HTN Elevated BP today Start amlodipine 2.5mg  daily She has follow-up with Dr Quillian Quince 06/20/17  Merlin Return in a year  Thompson Grayer MD, [facility] 06/17/2017 9:41 AM

## 2017-06-20 ENCOUNTER — Ambulatory Visit (INDEPENDENT_AMBULATORY_CARE_PROVIDER_SITE_OTHER): Payer: Medicare HMO | Admitting: *Deleted

## 2017-06-20 DIAGNOSIS — K5901 Slow transit constipation: Secondary | ICD-10-CM | POA: Diagnosis not present

## 2017-06-20 DIAGNOSIS — G301 Alzheimer's disease with late onset: Secondary | ICD-10-CM | POA: Diagnosis not present

## 2017-06-20 DIAGNOSIS — I495 Sick sinus syndrome: Secondary | ICD-10-CM

## 2017-06-20 DIAGNOSIS — Z9189 Other specified personal risk factors, not elsewhere classified: Secondary | ICD-10-CM | POA: Diagnosis not present

## 2017-06-20 DIAGNOSIS — Z6820 Body mass index (BMI) 20.0-20.9, adult: Secondary | ICD-10-CM | POA: Diagnosis not present

## 2017-06-20 DIAGNOSIS — G40301 Generalized idiopathic epilepsy and epileptic syndromes, not intractable, with status epilepticus: Secondary | ICD-10-CM | POA: Diagnosis not present

## 2017-06-20 DIAGNOSIS — Z0001 Encounter for general adult medical examination with abnormal findings: Secondary | ICD-10-CM | POA: Diagnosis not present

## 2017-06-20 DIAGNOSIS — Z23 Encounter for immunization: Secondary | ICD-10-CM | POA: Diagnosis not present

## 2017-06-20 DIAGNOSIS — M810 Age-related osteoporosis without current pathological fracture: Secondary | ICD-10-CM | POA: Diagnosis not present

## 2017-06-21 NOTE — Progress Notes (Signed)
Remote pacemaker transmission.   

## 2017-06-23 ENCOUNTER — Encounter: Payer: Self-pay | Admitting: Cardiology

## 2017-06-30 LAB — CUP PACEART INCLINIC DEVICE CHECK
Date Time Interrogation Session: 20190418131601
Implantable Lead Implant Date: 20110128
Implantable Lead Implant Date: 20110128
Implantable Lead Location: 753859
Implantable Lead Location: 753860
Implantable Lead Model: 1948
MDC IDC PG IMPLANT DT: 20110128
Pulse Gen Model: 2210
Pulse Gen Serial Number: 7096923

## 2017-07-13 LAB — CUP PACEART REMOTE DEVICE CHECK
Battery Remaining Longevity: 65 mo
Brady Statistic AP VP Percent: 0 %
Brady Statistic AS VP Percent: 0 %
Brady Statistic RA Percent Paced: 1 %
Brady Statistic RV Percent Paced: 0 %
Date Time Interrogation Session: 20190408060013
Implantable Lead Implant Date: 20110128
Lead Channel Impedance Value: 360 Ohm
Lead Channel Impedance Value: 590 Ohm
Lead Channel Pacing Threshold Amplitude: 0.75 V
Lead Channel Pacing Threshold Amplitude: 1 V
Lead Channel Pacing Threshold Pulse Width: 0.4 ms
Lead Channel Sensing Intrinsic Amplitude: 2.9 mV
Lead Channel Setting Pacing Amplitude: 2 V
Lead Channel Setting Pacing Amplitude: 2.5 V
Lead Channel Setting Sensing Sensitivity: 2 mV
MDC IDC LEAD IMPLANT DT: 20110128
MDC IDC LEAD LOCATION: 753859
MDC IDC LEAD LOCATION: 753860
MDC IDC MSMT BATTERY REMAINING PERCENTAGE: 51 %
MDC IDC MSMT BATTERY VOLTAGE: 2.87 V
MDC IDC MSMT LEADCHNL RA PACING THRESHOLD PULSEWIDTH: 0.4 ms
MDC IDC MSMT LEADCHNL RV SENSING INTR AMPL: 8.3 mV
MDC IDC PG IMPLANT DT: 20110128
MDC IDC SET LEADCHNL RV PACING PULSEWIDTH: 0.4 ms
MDC IDC STAT BRADY AP VS PERCENT: 1 %
MDC IDC STAT BRADY AS VS PERCENT: 99 %
Pulse Gen Serial Number: 7096923

## 2017-08-09 DIAGNOSIS — H524 Presbyopia: Secondary | ICD-10-CM | POA: Diagnosis not present

## 2017-08-09 DIAGNOSIS — H40013 Open angle with borderline findings, low risk, bilateral: Secondary | ICD-10-CM | POA: Diagnosis not present

## 2017-09-19 ENCOUNTER — Ambulatory Visit (INDEPENDENT_AMBULATORY_CARE_PROVIDER_SITE_OTHER): Payer: Medicare HMO | Admitting: *Deleted

## 2017-09-19 DIAGNOSIS — R001 Bradycardia, unspecified: Secondary | ICD-10-CM

## 2017-09-19 DIAGNOSIS — R55 Syncope and collapse: Secondary | ICD-10-CM

## 2017-09-19 NOTE — Progress Notes (Signed)
Remote pacemaker transmission.   

## 2017-10-04 LAB — CUP PACEART REMOTE DEVICE CHECK
Battery Remaining Percentage: 46 %
Battery Voltage: 2.86 V
Brady Statistic AP VP Percent: 1 %
Brady Statistic AP VS Percent: 1 %
Brady Statistic AS VS Percent: 99 %
Brady Statistic RV Percent Paced: 1 %
Date Time Interrogation Session: 20190708063642
Implantable Lead Implant Date: 20110128
Implantable Lead Location: 753860
Implantable Lead Model: 1948
Implantable Pulse Generator Implant Date: 20110128
Lead Channel Pacing Threshold Amplitude: 0.75 V
Lead Channel Pacing Threshold Amplitude: 1 V
Lead Channel Pacing Threshold Pulse Width: 0.4 ms
Lead Channel Sensing Intrinsic Amplitude: 2.9 mV
Lead Channel Setting Pacing Amplitude: 2.5 V
Lead Channel Setting Pacing Pulse Width: 0.4 ms
Lead Channel Setting Sensing Sensitivity: 2 mV
MDC IDC LEAD IMPLANT DT: 20110128
MDC IDC LEAD LOCATION: 753859
MDC IDC MSMT BATTERY REMAINING LONGEVITY: 59 mo
MDC IDC MSMT LEADCHNL RA IMPEDANCE VALUE: 400 Ohm
MDC IDC MSMT LEADCHNL RA PACING THRESHOLD PULSEWIDTH: 0.4 ms
MDC IDC MSMT LEADCHNL RV IMPEDANCE VALUE: 630 Ohm
MDC IDC MSMT LEADCHNL RV SENSING INTR AMPL: 9 mV
MDC IDC SET LEADCHNL RA PACING AMPLITUDE: 2 V
MDC IDC STAT BRADY AS VP PERCENT: 1 %
MDC IDC STAT BRADY RA PERCENT PACED: 1 %
Pulse Gen Serial Number: 7096923

## 2017-10-17 DIAGNOSIS — H26491 Other secondary cataract, right eye: Secondary | ICD-10-CM | POA: Diagnosis not present

## 2017-10-17 DIAGNOSIS — H35372 Puckering of macula, left eye: Secondary | ICD-10-CM | POA: Diagnosis not present

## 2017-10-17 DIAGNOSIS — H40013 Open angle with borderline findings, low risk, bilateral: Secondary | ICD-10-CM | POA: Diagnosis not present

## 2017-10-17 DIAGNOSIS — Z961 Presence of intraocular lens: Secondary | ICD-10-CM | POA: Diagnosis not present

## 2017-10-17 DIAGNOSIS — H26493 Other secondary cataract, bilateral: Secondary | ICD-10-CM | POA: Diagnosis not present

## 2017-10-31 DIAGNOSIS — H26492 Other secondary cataract, left eye: Secondary | ICD-10-CM | POA: Diagnosis not present

## 2017-11-09 DIAGNOSIS — G301 Alzheimer's disease with late onset: Secondary | ICD-10-CM | POA: Diagnosis not present

## 2017-11-09 DIAGNOSIS — E782 Mixed hyperlipidemia: Secondary | ICD-10-CM | POA: Diagnosis not present

## 2017-11-09 DIAGNOSIS — I10 Essential (primary) hypertension: Secondary | ICD-10-CM | POA: Diagnosis not present

## 2017-11-23 DIAGNOSIS — Z23 Encounter for immunization: Secondary | ICD-10-CM | POA: Diagnosis not present

## 2017-11-23 DIAGNOSIS — M545 Low back pain: Secondary | ICD-10-CM | POA: Diagnosis not present

## 2017-11-23 DIAGNOSIS — Z6820 Body mass index (BMI) 20.0-20.9, adult: Secondary | ICD-10-CM | POA: Diagnosis not present

## 2017-12-13 ENCOUNTER — Other Ambulatory Visit: Payer: Self-pay | Admitting: Internal Medicine

## 2017-12-19 ENCOUNTER — Ambulatory Visit (INDEPENDENT_AMBULATORY_CARE_PROVIDER_SITE_OTHER): Payer: Medicare HMO | Admitting: *Deleted

## 2017-12-19 DIAGNOSIS — R55 Syncope and collapse: Secondary | ICD-10-CM

## 2017-12-19 DIAGNOSIS — R001 Bradycardia, unspecified: Secondary | ICD-10-CM

## 2017-12-19 NOTE — Progress Notes (Signed)
Remote pacemaker transmission.   

## 2017-12-28 ENCOUNTER — Encounter: Payer: Self-pay | Admitting: Cardiology

## 2018-01-26 ENCOUNTER — Telehealth: Payer: Self-pay | Admitting: Internal Medicine

## 2018-01-26 NOTE — Telephone Encounter (Signed)
New Message °

## 2018-01-26 NOTE — Telephone Encounter (Signed)
Patient's daughter Lucia Estelle calling because her mother, Ms. Hiott called her and told her that her chest hurt in the area of her PPM, it has hurt on and off for 2 days. She would like an appointment to see Dr. Rayann Heman. I advised that if I could speak with Ms. Lemaire I would like to inquire about the site appearance and her condition. I could offer an appointment in the [facility] tomorrow but it would be in [place]. No appointments available in [place], next schedule in [facility] 02/17/18. She will talk to her mother and call back. Direct number given.

## 2018-01-30 DIAGNOSIS — Z1231 Encounter for screening mammogram for malignant neoplasm of breast: Secondary | ICD-10-CM | POA: Diagnosis not present

## 2018-02-03 LAB — CUP PACEART REMOTE DEVICE CHECK
Battery Remaining Longevity: 51 mo
Battery Remaining Percentage: 40 %
Battery Voltage: 2.84 V
Brady Statistic RA Percent Paced: 1 %
Implantable Lead Implant Date: 20110128
Implantable Lead Model: 1948
Implantable Pulse Generator Implant Date: 20110128
Lead Channel Impedance Value: 400 Ohm
Lead Channel Pacing Threshold Pulse Width: 0.4 ms
Lead Channel Sensing Intrinsic Amplitude: 10.1 mV
Lead Channel Setting Pacing Amplitude: 2 V
Lead Channel Setting Pacing Amplitude: 2.5 V
MDC IDC LEAD IMPLANT DT: 20110128
MDC IDC LEAD LOCATION: 753859
MDC IDC LEAD LOCATION: 753860
MDC IDC MSMT LEADCHNL RA PACING THRESHOLD AMPLITUDE: 1 V
MDC IDC MSMT LEADCHNL RA SENSING INTR AMPL: 2.6 mV
MDC IDC MSMT LEADCHNL RV IMPEDANCE VALUE: 630 Ohm
MDC IDC MSMT LEADCHNL RV PACING THRESHOLD AMPLITUDE: 0.75 V
MDC IDC MSMT LEADCHNL RV PACING THRESHOLD PULSEWIDTH: 0.4 ms
MDC IDC SESS DTM: 20191007060032
MDC IDC SET LEADCHNL RV PACING PULSEWIDTH: 0.4 ms
MDC IDC SET LEADCHNL RV SENSING SENSITIVITY: 2 mV
MDC IDC STAT BRADY AP VP PERCENT: 1 %
MDC IDC STAT BRADY AP VS PERCENT: 1 %
MDC IDC STAT BRADY AS VP PERCENT: 1 %
MDC IDC STAT BRADY AS VS PERCENT: 99 %
MDC IDC STAT BRADY RV PERCENT PACED: 1 %
Pulse Gen Model: 2210
Pulse Gen Serial Number: 7096923

## 2018-02-03 NOTE — Telephone Encounter (Signed)
LVM/sss

## 2018-02-20 ENCOUNTER — Telehealth: Payer: Self-pay | Admitting: *Deleted

## 2018-02-20 NOTE — Telephone Encounter (Signed)
Pt says device has been hurting at the site for the last 3 weeks off and on - denies any other symptoms

## 2018-02-20 NOTE — Telephone Encounter (Signed)
LVM/sss

## 2018-02-21 NOTE — Telephone Encounter (Signed)
Spoke with pt's daughter. She states pt has no fever and site does not appear red or inflamed. Pt is set up to see the device clinic on Friday Dec 13 at 10am. Pt's daughter understands if pt becomes febrile, has chills, or if PPM site becomes warm and inflamed pt should report to the ED.

## 2018-02-21 NOTE — Telephone Encounter (Signed)
Patient daughter calling back b/c patient is having pain at the device site. Call routed to device tech RN. Informed pt daughter that a Chief Operating Officer would call her back. Pt daughter verbalized understanding and stated to call her at her work number first which is [phone] if we can not reach her there call her mobile [phone]

## 2018-02-24 ENCOUNTER — Ambulatory Visit: Payer: Managed Care, Other (non HMO) | Admitting: *Deleted

## 2018-02-24 DIAGNOSIS — I495 Sick sinus syndrome: Secondary | ICD-10-CM

## 2018-02-24 NOTE — Progress Notes (Signed)
Pt seen d/t c/o of pain at device site, incision site intact, no redness or swelling noted. Informed pt to try taking Tylenol for the pain for the next two weeks and tapering it off and to call back if pain persists pt voiced understanding.

## 2018-03-20 ENCOUNTER — Ambulatory Visit (INDEPENDENT_AMBULATORY_CARE_PROVIDER_SITE_OTHER): Payer: Managed Care, Other (non HMO)

## 2018-03-20 DIAGNOSIS — I495 Sick sinus syndrome: Secondary | ICD-10-CM | POA: Diagnosis not present

## 2018-03-21 LAB — CUP PACEART REMOTE DEVICE CHECK
Battery Remaining Percentage: 40 %
Battery Voltage: 2.84 V
Brady Statistic AP VP Percent: 1 %
Brady Statistic RA Percent Paced: 1 %
Brady Statistic RV Percent Paced: 1 %
Date Time Interrogation Session: 20200106070017
Implantable Lead Implant Date: 20110128
Implantable Lead Implant Date: 20110128
Implantable Lead Location: 753859
Implantable Pulse Generator Implant Date: 20110128
Lead Channel Impedance Value: 600 Ohm
Lead Channel Pacing Threshold Amplitude: 1 V
Lead Channel Sensing Intrinsic Amplitude: 3 mV
Lead Channel Setting Sensing Sensitivity: 2 mV
MDC IDC LEAD LOCATION: 753860
MDC IDC MSMT BATTERY REMAINING LONGEVITY: 51 mo
MDC IDC MSMT LEADCHNL RA IMPEDANCE VALUE: 400 Ohm
MDC IDC MSMT LEADCHNL RA PACING THRESHOLD PULSEWIDTH: 0.4 ms
MDC IDC MSMT LEADCHNL RV PACING THRESHOLD AMPLITUDE: 0.75 V
MDC IDC MSMT LEADCHNL RV PACING THRESHOLD PULSEWIDTH: 0.4 ms
MDC IDC MSMT LEADCHNL RV SENSING INTR AMPL: 9.7 mV
MDC IDC SET LEADCHNL RA PACING AMPLITUDE: 2 V
MDC IDC SET LEADCHNL RV PACING AMPLITUDE: 2.5 V
MDC IDC SET LEADCHNL RV PACING PULSEWIDTH: 0.4 ms
MDC IDC STAT BRADY AP VS PERCENT: 1 %
MDC IDC STAT BRADY AS VP PERCENT: 1 %
MDC IDC STAT BRADY AS VS PERCENT: 99 %
Pulse Gen Model: 2210
Pulse Gen Serial Number: 7096923

## 2018-03-21 NOTE — Progress Notes (Signed)
Remote pacemaker transmission.   

## 2018-03-28 DIAGNOSIS — G301 Alzheimer's disease with late onset: Secondary | ICD-10-CM | POA: Diagnosis not present

## 2018-03-28 DIAGNOSIS — E782 Mixed hyperlipidemia: Secondary | ICD-10-CM | POA: Diagnosis not present

## 2018-03-28 DIAGNOSIS — I10 Essential (primary) hypertension: Secondary | ICD-10-CM | POA: Diagnosis not present

## 2018-03-28 DIAGNOSIS — R5383 Other fatigue: Secondary | ICD-10-CM | POA: Diagnosis not present

## 2018-03-31 DIAGNOSIS — G301 Alzheimer's disease with late onset: Secondary | ICD-10-CM | POA: Diagnosis not present

## 2018-03-31 DIAGNOSIS — K5901 Slow transit constipation: Secondary | ICD-10-CM | POA: Diagnosis not present

## 2018-03-31 DIAGNOSIS — Z6820 Body mass index (BMI) 20.0-20.9, adult: Secondary | ICD-10-CM | POA: Diagnosis not present

## 2018-03-31 DIAGNOSIS — G40301 Generalized idiopathic epilepsy and epileptic syndromes, not intractable, with status epilepticus: Secondary | ICD-10-CM | POA: Diagnosis not present

## 2018-03-31 DIAGNOSIS — M810 Age-related osteoporosis without current pathological fracture: Secondary | ICD-10-CM | POA: Diagnosis not present

## 2018-05-08 DIAGNOSIS — M8588 Other specified disorders of bone density and structure, other site: Secondary | ICD-10-CM | POA: Diagnosis not present

## 2018-05-08 DIAGNOSIS — Z780 Asymptomatic menopausal state: Secondary | ICD-10-CM | POA: Diagnosis not present

## 2018-05-08 DIAGNOSIS — M810 Age-related osteoporosis without current pathological fracture: Secondary | ICD-10-CM | POA: Diagnosis not present

## 2018-05-09 DIAGNOSIS — H40013 Open angle with borderline findings, low risk, bilateral: Secondary | ICD-10-CM | POA: Diagnosis not present

## 2018-06-08 ENCOUNTER — Telehealth: Payer: Self-pay

## 2018-06-08 NOTE — Telephone Encounter (Signed)
Left message for pt and pt daughter to give [place] a call back regarding virtual visit appt on 06/09/18.

## 2018-06-08 NOTE — Telephone Encounter (Signed)
Follow Up:; ° ° °Returning your call. °

## 2018-06-08 NOTE — Telephone Encounter (Signed)
° ° °  Please return call to daughter to discuss virtual visit

## 2018-06-09 ENCOUNTER — Other Ambulatory Visit: Payer: Self-pay

## 2018-06-09 ENCOUNTER — Telehealth (INDEPENDENT_AMBULATORY_CARE_PROVIDER_SITE_OTHER): Payer: Medicare HMO | Admitting: Internal Medicine

## 2018-06-09 DIAGNOSIS — I10 Essential (primary) hypertension: Secondary | ICD-10-CM | POA: Diagnosis not present

## 2018-06-09 DIAGNOSIS — I495 Sick sinus syndrome: Secondary | ICD-10-CM

## 2018-06-09 NOTE — Progress Notes (Signed)
Electrophysiology TeleHealth Note   Due to national recommendations of social distancing due to [place] 19, an audio telehealth visit is felt to be most appropriate for this patient at this time.  Verbal consent was obtained from the patent by me for this telephone visit today.  Date:  06/09/2018   ID:  Rhonda Hill, DOB [birth year], MRN [id number]  Location: patient's home  Provider location: [facility]  Evaluation Performed: Follow-up visit  PCP:  Caryl Bis, MD  Electrophysiologist:  Dr Rayann Heman  Chief Complaint:  hypertension  History of Present Illness:    Rhonda Hill is a 78 y.o. female who presents via audio conferencing for a telehealth visit today.  Since last being seen in our clinic, the patient reports doing very well.  Today, she denies symptoms of palpitations, chest pain, shortness of breath,  lower extremity edema, dizziness, presyncope, or syncope.  The patient is otherwise without complaint today.  The patient denies symptoms of fevers, chills, cough, or new SOB worrisome for COVID 19.  Past Medical History:  Diagnosis Date  . Paroxysmal supraventricular tachycardia ([place])   . Personal history of tobacco use, presenting hazards to health   . Seizure disorder ([place])    followed by Dr Erling Cruz  . Sinoatrial node dysfunction (HCC)    with syncope, s/p PPM (SJM)    Current Outpatient Medications  Medication Sig Dispense Refill  . alendronate (FOSAMAX) 70 MG tablet Take 1 tablet by mouth once a week.  3  . amLODipine (NORVASC) 2.5 MG tablet TAKE 1 TABLET BY MOUTH EVERY DAY 90 tablet 1  . carbamazepine (TEGRETOL) 200 MG tablet Take 600 mg by mouth 2 (two) times daily.      Marland Kitchen donepezil (ARICEPT) 10 MG tablet Take 1 tablet by mouth daily.  1  . levETIRAcetam (KEPPRA) 500 MG tablet Take 1,000 mg by mouth every 12 (twelve) hours.      . polyethylene glycol powder (GLYCOLAX/MIRALAX) powder Take 17 g by mouth daily as needed.  6   No current  facility-administered medications for this visit.     Allergies:   Patient has no known allergies.   Social History:  The patient  reports that she quit smoking about 35 years ago. Her smoking use included cigarettes. She started smoking about 63 years ago. She has a 12.00 pack-year smoking history. She has never used smokeless tobacco. She reports that she does not drink alcohol or use drugs.   Family History:  + HTN  ROS:  Please see the history of present illness.   All other systems are personally reviewed and negative.    Exam:    Vital Signs:  There were no vitals taken for this visit.  Well sounding, not distressed   Labs/Other Tests and Data Reviewed:    Recent Labs: No results found for requested labs within last 8760 hours.   Wt Readings from Last 3 Encounters:  06/17/17 101 lb 9.6 oz (46.1 kg)  03/19/16 103 lb (46.7 kg)  03/21/15 98 lb (44.5 kg)     Other studies personally reviewed: Additional studies/ records that were reviewed today include: my prior office note  Review of the above records today demonstrates: as above    Last device remote is reviewed from [facility] PDF dated 03/20/2018 which reveals normal device function, no arrhythmias    ASSESSMENT & PLAN:    1.  Sick sinus syndrome Normal pacemaker function Compliant with remotes  2. HTN Stable No change required  today  3. COVID 19 screen The patient denies symptoms of COVID 19 at this time.  The importance of social distancing was discussed today.  Follow-up:  12 months with me in the [place] office Next remote: 06/2018  Patient Risk:  after full review of this patients clinical status, I feel that they are at moderate risk at this time.  Today, I have spent 15 minutes with the patient with telehealth technology discussing hypertension and COVID 19 .    Army Fossa, MD  06/09/2018 2:37 PM     [address] [place] [address] [phone]  (office) [phone] (fax)

## 2018-06-16 ENCOUNTER — Encounter: Payer: Medicare HMO | Admitting: Internal Medicine

## 2018-06-19 ENCOUNTER — Other Ambulatory Visit: Payer: Self-pay

## 2018-06-19 ENCOUNTER — Ambulatory Visit (INDEPENDENT_AMBULATORY_CARE_PROVIDER_SITE_OTHER): Payer: Medicare HMO | Admitting: *Deleted

## 2018-06-19 DIAGNOSIS — I495 Sick sinus syndrome: Secondary | ICD-10-CM

## 2018-06-19 LAB — CUP PACEART REMOTE DEVICE CHECK
Battery Remaining Longevity: 45 mo
Battery Remaining Percentage: 35 %
Battery Voltage: 2.83 V
Brady Statistic AP VP Percent: 1 %
Brady Statistic AP VS Percent: 1 %
Brady Statistic AS VP Percent: 1 %
Brady Statistic AS VS Percent: 99 %
Brady Statistic RA Percent Paced: 1 %
Brady Statistic RV Percent Paced: 1 %
Date Time Interrogation Session: 20200406061107
Implantable Lead Implant Date: 20110128
Implantable Lead Implant Date: 20110128
Implantable Lead Location: 753859
Implantable Lead Location: 753860
Implantable Lead Model: 1948
Implantable Pulse Generator Implant Date: 20110128
Lead Channel Impedance Value: 400 Ohm
Lead Channel Impedance Value: 640 Ohm
Lead Channel Pacing Threshold Amplitude: 0.75 V
Lead Channel Pacing Threshold Amplitude: 1 V
Lead Channel Pacing Threshold Pulse Width: 0.4 ms
Lead Channel Pacing Threshold Pulse Width: 0.4 ms
Lead Channel Sensing Intrinsic Amplitude: 2.5 mV
Lead Channel Sensing Intrinsic Amplitude: 8.4 mV
Lead Channel Setting Pacing Amplitude: 2 V
Lead Channel Setting Pacing Amplitude: 2.5 V
Lead Channel Setting Pacing Pulse Width: 0.4 ms
Lead Channel Setting Sensing Sensitivity: 2 mV
Pulse Gen Model: 2210
Pulse Gen Serial Number: 7096923

## 2018-06-28 ENCOUNTER — Encounter: Payer: Self-pay | Admitting: Cardiology

## 2018-06-28 NOTE — Progress Notes (Signed)
Remote pacemaker transmission.   

## 2018-09-18 ENCOUNTER — Ambulatory Visit (INDEPENDENT_AMBULATORY_CARE_PROVIDER_SITE_OTHER): Payer: Medicare HMO | Admitting: *Deleted

## 2018-09-18 DIAGNOSIS — I495 Sick sinus syndrome: Secondary | ICD-10-CM | POA: Diagnosis not present

## 2018-09-18 LAB — CUP PACEART REMOTE DEVICE CHECK
Date Time Interrogation Session: 20200706160805
Implantable Lead Implant Date: 20110128
Implantable Lead Implant Date: 20110128
Implantable Lead Location: 753859
Implantable Lead Location: 753860
Implantable Lead Model: 1948
Implantable Pulse Generator Implant Date: 20110128
Pulse Gen Model: 2210
Pulse Gen Serial Number: 7096923

## 2018-09-28 ENCOUNTER — Encounter: Payer: Self-pay | Admitting: Cardiology

## 2018-09-28 NOTE — Progress Notes (Signed)
Remote pacemaker transmission.   

## 2018-12-18 ENCOUNTER — Ambulatory Visit (INDEPENDENT_AMBULATORY_CARE_PROVIDER_SITE_OTHER): Payer: Medicare HMO | Admitting: *Deleted

## 2018-12-18 DIAGNOSIS — R55 Syncope and collapse: Secondary | ICD-10-CM

## 2018-12-18 DIAGNOSIS — I495 Sick sinus syndrome: Secondary | ICD-10-CM

## 2018-12-19 LAB — CUP PACEART REMOTE DEVICE CHECK
Battery Remaining Longevity: 32 mo
Battery Remaining Percentage: 25 %
Battery Voltage: 2.8 V
Brady Statistic AP VP Percent: 1 %
Brady Statistic AP VS Percent: 1 %
Brady Statistic AS VP Percent: 1 %
Brady Statistic AS VS Percent: 99 %
Brady Statistic RA Percent Paced: 1 %
Brady Statistic RV Percent Paced: 1 %
Date Time Interrogation Session: 20201005060014
Implantable Lead Implant Date: 20110128
Implantable Lead Implant Date: 20110128
Implantable Lead Location: 753859
Implantable Lead Location: 753860
Implantable Lead Model: 1948
Implantable Pulse Generator Implant Date: 20110128
Lead Channel Impedance Value: 400 Ohm
Lead Channel Impedance Value: 630 Ohm
Lead Channel Pacing Threshold Amplitude: 0.75 V
Lead Channel Pacing Threshold Amplitude: 1 V
Lead Channel Pacing Threshold Pulse Width: 0.4 ms
Lead Channel Pacing Threshold Pulse Width: 0.4 ms
Lead Channel Sensing Intrinsic Amplitude: 12 mV
Lead Channel Sensing Intrinsic Amplitude: 2.1 mV
Lead Channel Setting Pacing Amplitude: 2 V
Lead Channel Setting Pacing Amplitude: 2.5 V
Lead Channel Setting Pacing Pulse Width: 0.4 ms
Lead Channel Setting Sensing Sensitivity: 2 mV
Pulse Gen Model: 2210
Pulse Gen Serial Number: 7096923

## 2018-12-26 NOTE — Progress Notes (Signed)
Remote pacemaker transmission.   

## 2019-03-19 ENCOUNTER — Ambulatory Visit (INDEPENDENT_AMBULATORY_CARE_PROVIDER_SITE_OTHER): Payer: Medicare HMO | Admitting: *Deleted

## 2019-03-19 DIAGNOSIS — I495 Sick sinus syndrome: Secondary | ICD-10-CM

## 2019-03-20 ENCOUNTER — Telehealth: Payer: Self-pay | Admitting: *Deleted

## 2019-03-20 LAB — CUP PACEART REMOTE DEVICE CHECK
Date Time Interrogation Session: 20210104064725
Implantable Lead Implant Date: 20110128
Implantable Lead Implant Date: 20110128
Implantable Lead Location: 753859
Implantable Lead Location: 753860
Implantable Lead Model: 1948
Implantable Pulse Generator Implant Date: 20110128
Pulse Gen Model: 2210
Pulse Gen Serial Number: 7096923

## 2019-03-20 NOTE — Telephone Encounter (Signed)
LMOM requesting call back to DC. Direct number and office hours provided.   PPM transmission from 03/19/19 processed. Normal device function. Presenting rhythm appears SVT (? junctional tach) in the 130s. HVR episode from 03/17/19 at 15:24 appears similar. Will discuss any symptoms with patient.

## 2019-03-21 ENCOUNTER — Telehealth: Payer: Self-pay | Admitting: Emergency Medicine

## 2019-03-21 NOTE — Telephone Encounter (Signed)
-----   Message from Thompson Grayer, MD sent at 03/20/2019  8:32 PM EST ----- Remote device check reviewed.   Device report notable for:   ongoing SVT  Please do another quick remote to see from presenting rhythm if this has terminated.  If ongoing, bring in to pace terminate the rhythm in the device clinic. If now back in sinus, schedule virtual visit with me.

## 2019-03-21 NOTE — Telephone Encounter (Signed)
LMOM for patient to send manual transmission and device clinic # provided to contact for assistance.

## 2019-03-22 NOTE — Telephone Encounter (Signed)
Third attempt (including message left 03/20/19):  [facility] requesting call back to DC. Direct number and office hours provided.

## 2019-03-22 NOTE — Telephone Encounter (Signed)
See phone note opened 03/21/19 for additional attempts to reach patient.

## 2019-03-23 NOTE — Telephone Encounter (Signed)
The pt states she was not at home and someone tried to call her. I told her the nurse would like for her to send a transmission with her home monitor. I told her once the nurse receive the transmission she will review it and give her a call back.   Transmission received.

## 2019-03-23 NOTE — Telephone Encounter (Signed)
Transmission reviewed. Presenting rhythm AS/VS 94bpm. No episodes since last transmission on 03/19/19.  Spoke with patient. She was asleep and asymptomatic with SVT episode on 03/19/19. Pt reports she is doing well. Pt agreeable to virtual visit (via telephone call) with Dr. Rayann Heman. Will forward message to scheduler for additional assistance. No questions or concerns at this time.

## 2019-04-03 ENCOUNTER — Telehealth: Payer: Self-pay

## 2019-04-03 NOTE — Telephone Encounter (Signed)
Left a message regarding virtual appt on 04/04/18.

## 2019-04-04 ENCOUNTER — Telehealth: Payer: Medicare HMO | Admitting: Internal Medicine

## 2019-04-04 ENCOUNTER — Encounter: Payer: Self-pay | Admitting: Internal Medicine

## 2019-04-04 ENCOUNTER — Other Ambulatory Visit: Payer: Self-pay

## 2019-04-04 ENCOUNTER — Telehealth (INDEPENDENT_AMBULATORY_CARE_PROVIDER_SITE_OTHER): Payer: Medicare HMO | Admitting: Internal Medicine

## 2019-04-04 VITALS — Ht 60.0 in | Wt 103.0 lb

## 2019-04-04 DIAGNOSIS — I495 Sick sinus syndrome: Secondary | ICD-10-CM | POA: Diagnosis not present

## 2019-04-04 DIAGNOSIS — I10 Essential (primary) hypertension: Secondary | ICD-10-CM

## 2019-04-04 DIAGNOSIS — I471 Supraventricular tachycardia: Secondary | ICD-10-CM

## 2019-04-04 NOTE — Progress Notes (Signed)
Electrophysiology TeleHealth Note  Due to national recommendations of social distancing due to [place] 19, an audio telehealth visit is felt to be most appropriate for this patient at this time.  Verbal consent was obtained by me for the telehealth visit today.  The patient does not have capability for a virtual visit.  A phone visit is therefore required today.   Date:  04/04/2019   ID:  Rhonda Hill, DOB June 13, 1940, MRN [id number]  Location: patient's home  Provider location:  [facility]  Evaluation Performed: Follow-up visit  PCP:  Caryl Bis, MD   Electrophysiologist:  Dr Rayann Heman  Chief Complaint:  palpitations  History of Present Illness:    Rhonda Hill is a 79 y.o. female who presents via telehealth conferencing today.  Since last being seen in our clinic, the patient reports doing very well.  Today, she denies symptoms of palpitations, chest pain, shortness of breath,  lower extremity edema, dizziness, presyncope, or syncope.  The patient is otherwise without complaint today.  The patient denies symptoms of fevers, chills, cough, or new SOB worrisome for COVID 19.  Past Medical History:  Diagnosis Date  . HYPERLIPIDEMIA-MIXED 11/07/2008   Qualifier: Diagnosis of  By: Mare Ferrari, RMA, Sherri    . 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 Paroxysmal supraventricular tachycardia ([place])   . Personal history of tobacco use, presenting hazards to health   . SEIZURE DISORDER 04/04/2009   Qualifier: Diagnosis of  By: Rayann Heman, MD, Jeneen Rinks    . Seizure disorder ([place])    followed by Dr Erling Cruz  . Sinoatrial node dysfunction ([facility])    with syncope, s/p PPM (SJM)  . SYNCOPE 11/13/2008   Qualifier: Diagnosis of  By: Domenic Polite, MD, Phillips Hay       Current Outpatient Medications  Medication Sig Dispense Refill  . acetaminophen (TYLENOL) 325 MG tablet Take 325 mg by mouth every 6 (six) hours as needed for mild pain.    . carbamazepine (TEGRETOL) 200 MG tablet Take 200 mg by  mouth daily.    Marland Kitchen donepezil (ARICEPT) 10 MG tablet Take 1 tablet by mouth daily.  1  . levETIRAcetam (KEPPRA) 500 MG tablet Take 500 mg by mouth daily.    . Multiple Vitamins-Minerals (MULTIVITAMIN WITH MINERALS) tablet Take 1 tablet by mouth daily.    . polyethylene glycol powder (GLYCOLAX/MIRALAX) powder Take 17 g by mouth daily as needed.  6   No current facility-administered medications for this visit.    Allergies:   Patient has no known allergies.   Social History:  The patient  reports that she quit smoking about 36 years ago. Her smoking use included cigarettes. She started smoking about 64 years ago. She has a 12.00 pack-year smoking history. She has never used smokeless tobacco. She reports that she does not drink alcohol or use drugs.   Family History:  + HTN  ROS:  Please see the history of present illness.   All other systems are personally reviewed and negative.    Exam:    Vital Signs:  Ht 5' (1.524 m)   Wt 103 lb (46.7 kg)   BMI 20.12 kg/m   Well sounding, conversant  Labs/Other Tests and Data Reviewed:    Recent Labs: No results found for requested labs within last 8760 hours.   Wt Readings from Last 3 Encounters:  04/04/19 103 lb (46.7 kg)  06/17/17 101 lb 9.6 oz (46.1 kg)  03/19/16 103 lb (46.7 kg)     Last  device remote is reviewed from [place] PDF which reveals normal device function, SVT observed   ASSESSMENT & PLAN:    1.  SVT New Minimally symptomatic We discussed ablation, she would prefer to continue as is for now  2. Sick sinus syndrome Normal device function by remotes  3. HTN Stable No change required today    Follow-up:  12 months with me If her SVT worsens she will contact my office   Patient Risk:  after full review of this patients clinical status, I feel that they are at moderate risk at this time.  Today, I have spent 15 minutes with the patient with telehealth technology discussing arrhythmia management .    Army Fossa, MD  04/04/2019 8:11 AM     [facility] [address] [place] [address] [phone] (office) [phone] (fax)

## 2019-06-15 ENCOUNTER — Encounter: Payer: Medicare HMO | Admitting: Internal Medicine

## 2019-06-18 ENCOUNTER — Ambulatory Visit (INDEPENDENT_AMBULATORY_CARE_PROVIDER_SITE_OTHER): Payer: Medicare HMO | Admitting: *Deleted

## 2019-06-18 DIAGNOSIS — I495 Sick sinus syndrome: Secondary | ICD-10-CM

## 2019-06-18 LAB — CUP PACEART REMOTE DEVICE CHECK
Battery Remaining Longevity: 22 mo
Battery Remaining Percentage: 17 %
Battery Voltage: 2.75 V
Brady Statistic AP VP Percent: 1 %
Brady Statistic AP VS Percent: 1 %
Brady Statistic AS VP Percent: 1 %
Brady Statistic AS VS Percent: 99 %
Brady Statistic RA Percent Paced: 1 %
Brady Statistic RV Percent Paced: 1 %
Date Time Interrogation Session: 20210405020014
Implantable Lead Implant Date: 20110128
Implantable Lead Implant Date: 20110128
Implantable Lead Location: 753859
Implantable Lead Location: 753860
Implantable Lead Model: 1948
Implantable Pulse Generator Implant Date: 20110128
Lead Channel Impedance Value: 340 Ohm
Lead Channel Impedance Value: 630 Ohm
Lead Channel Pacing Threshold Amplitude: 0.75 V
Lead Channel Pacing Threshold Amplitude: 1 V
Lead Channel Pacing Threshold Pulse Width: 0.4 ms
Lead Channel Pacing Threshold Pulse Width: 0.4 ms
Lead Channel Sensing Intrinsic Amplitude: 12 mV
Lead Channel Sensing Intrinsic Amplitude: 3.1 mV
Lead Channel Setting Pacing Amplitude: 2 V
Lead Channel Setting Pacing Amplitude: 2.5 V
Lead Channel Setting Pacing Pulse Width: 0.4 ms
Lead Channel Setting Sensing Sensitivity: 2 mV
Pulse Gen Model: 2210
Pulse Gen Serial Number: 7096923

## 2019-06-19 NOTE — Progress Notes (Signed)
PPM Remote  

## 2019-07-20 ENCOUNTER — Encounter: Payer: Medicare HMO | Admitting: Internal Medicine

## 2019-09-18 ENCOUNTER — Ambulatory Visit (INDEPENDENT_AMBULATORY_CARE_PROVIDER_SITE_OTHER): Payer: Medicare HMO | Admitting: *Deleted

## 2019-09-18 DIAGNOSIS — R001 Bradycardia, unspecified: Secondary | ICD-10-CM | POA: Diagnosis not present

## 2019-09-18 LAB — CUP PACEART REMOTE DEVICE CHECK
Battery Remaining Longevity: 19 mo
Battery Remaining Percentage: 15 %
Battery Voltage: 2.74 V
Brady Statistic AP VP Percent: 1 %
Brady Statistic AP VS Percent: 1 %
Brady Statistic AS VP Percent: 1 %
Brady Statistic AS VS Percent: 99 %
Brady Statistic RA Percent Paced: 1 %
Brady Statistic RV Percent Paced: 1 %
Date Time Interrogation Session: 20210706024408
Implantable Lead Implant Date: 20110128
Implantable Lead Implant Date: 20110128
Implantable Lead Location: 753859
Implantable Lead Location: 753860
Implantable Lead Model: 1948
Implantable Pulse Generator Implant Date: 20110128
Lead Channel Impedance Value: 350 Ohm
Lead Channel Impedance Value: 580 Ohm
Lead Channel Pacing Threshold Amplitude: 0.75 V
Lead Channel Pacing Threshold Amplitude: 1 V
Lead Channel Pacing Threshold Pulse Width: 0.4 ms
Lead Channel Pacing Threshold Pulse Width: 0.4 ms
Lead Channel Sensing Intrinsic Amplitude: 4.1 mV
Lead Channel Sensing Intrinsic Amplitude: 9.5 mV
Lead Channel Setting Pacing Amplitude: 2 V
Lead Channel Setting Pacing Amplitude: 2.5 V
Lead Channel Setting Pacing Pulse Width: 0.4 ms
Lead Channel Setting Sensing Sensitivity: 2 mV
Pulse Gen Model: 2210
Pulse Gen Serial Number: 7096923

## 2019-09-19 NOTE — Progress Notes (Signed)
Remote pacemaker transmission.   

## 2019-12-17 ENCOUNTER — Ambulatory Visit (INDEPENDENT_AMBULATORY_CARE_PROVIDER_SITE_OTHER): Payer: Medicare HMO

## 2019-12-17 DIAGNOSIS — R001 Bradycardia, unspecified: Secondary | ICD-10-CM | POA: Diagnosis not present

## 2019-12-19 LAB — CUP PACEART REMOTE DEVICE CHECK
Battery Remaining Longevity: 10 mo
Battery Remaining Percentage: 8 %
Battery Voltage: 2.69 V
Brady Statistic AP VP Percent: 1 %
Brady Statistic AP VS Percent: 1 %
Brady Statistic AS VP Percent: 1 %
Brady Statistic AS VS Percent: 99 %
Brady Statistic RA Percent Paced: 1 %
Brady Statistic RV Percent Paced: 1 %
Date Time Interrogation Session: 20211005031934
Implantable Lead Implant Date: 20110128
Implantable Lead Implant Date: 20110128
Implantable Lead Location: 753859
Implantable Lead Location: 753860
Implantable Lead Model: 1948
Implantable Pulse Generator Implant Date: 20110128
Lead Channel Impedance Value: 380 Ohm
Lead Channel Impedance Value: 600 Ohm
Lead Channel Pacing Threshold Amplitude: 0.75 V
Lead Channel Pacing Threshold Amplitude: 1 V
Lead Channel Pacing Threshold Pulse Width: 0.4 ms
Lead Channel Pacing Threshold Pulse Width: 0.4 ms
Lead Channel Sensing Intrinsic Amplitude: 10 mV
Lead Channel Sensing Intrinsic Amplitude: 4.3 mV
Lead Channel Setting Pacing Amplitude: 2 V
Lead Channel Setting Pacing Amplitude: 2.5 V
Lead Channel Setting Pacing Pulse Width: 0.4 ms
Lead Channel Setting Sensing Sensitivity: 2 mV
Pulse Gen Model: 2210
Pulse Gen Serial Number: 7096923

## 2019-12-20 NOTE — Progress Notes (Signed)
Remote pacemaker transmission.   

## 2020-03-18 ENCOUNTER — Ambulatory Visit (INDEPENDENT_AMBULATORY_CARE_PROVIDER_SITE_OTHER): Payer: Medicare HMO

## 2020-03-18 DIAGNOSIS — R001 Bradycardia, unspecified: Secondary | ICD-10-CM

## 2020-03-18 LAB — CUP PACEART REMOTE DEVICE CHECK
Battery Remaining Longevity: 6 mo
Battery Remaining Percentage: 5 %
Battery Voltage: 2.66 V
Brady Statistic AP VP Percent: 1 %
Brady Statistic AP VS Percent: 1 %
Brady Statistic AS VP Percent: 1 %
Brady Statistic AS VS Percent: 99 %
Brady Statistic RA Percent Paced: 1 %
Brady Statistic RV Percent Paced: 1 %
Date Time Interrogation Session: 20220104020008
Implantable Lead Implant Date: 20110128
Implantable Lead Implant Date: 20110128
Implantable Lead Location: 753859
Implantable Lead Location: 753860
Implantable Lead Model: 1948
Implantable Pulse Generator Implant Date: 20110128
Lead Channel Impedance Value: 380 Ohm
Lead Channel Impedance Value: 600 Ohm
Lead Channel Pacing Threshold Amplitude: 0.75 V
Lead Channel Pacing Threshold Amplitude: 1 V
Lead Channel Pacing Threshold Pulse Width: 0.4 ms
Lead Channel Pacing Threshold Pulse Width: 0.4 ms
Lead Channel Sensing Intrinsic Amplitude: 10.4 mV
Lead Channel Sensing Intrinsic Amplitude: 4.1 mV
Lead Channel Setting Pacing Amplitude: 2 V
Lead Channel Setting Pacing Amplitude: 2.5 V
Lead Channel Setting Pacing Pulse Width: 0.4 ms
Lead Channel Setting Sensing Sensitivity: 2 mV
Pulse Gen Model: 2210
Pulse Gen Serial Number: 7096923

## 2020-04-01 NOTE — Progress Notes (Signed)
Remote pacemaker transmission.   

## 2020-06-13 ENCOUNTER — Encounter: Payer: Self-pay | Admitting: Internal Medicine

## 2020-06-13 ENCOUNTER — Ambulatory Visit (INDEPENDENT_AMBULATORY_CARE_PROVIDER_SITE_OTHER): Payer: Medicare HMO | Admitting: Internal Medicine

## 2020-06-13 VITALS — BP 126/70 | HR 74 | Ht 60.0 in | Wt 106.0 lb

## 2020-06-13 DIAGNOSIS — I10 Essential (primary) hypertension: Secondary | ICD-10-CM

## 2020-06-13 DIAGNOSIS — I471 Supraventricular tachycardia: Secondary | ICD-10-CM

## 2020-06-13 DIAGNOSIS — I495 Sick sinus syndrome: Secondary | ICD-10-CM | POA: Diagnosis not present

## 2020-06-13 DIAGNOSIS — R001 Bradycardia, unspecified: Secondary | ICD-10-CM

## 2020-06-13 NOTE — Patient Instructions (Signed)
Medication Instructions:  Continue all current medications.  Labwork: none  Testing/Procedures: none  Follow-Up: Continue monthly pacemaker checks - wireless   Any Other Special Instructions Will Be Listed Below (If Applicable).  If you need a refill on your cardiac medications before your next appointment, please call your pharmacy.

## 2020-06-13 NOTE — Progress Notes (Signed)
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  Since last being seen in our clinic, the patient reports doing very well.  Today, she denies symptoms of palpitations, chest pain, shortness of breath,  lower extremity edema, dizziness, presyncope, or syncope. She is accompanied by her caregiver who assists with history today.  The patient is otherwise without complaint today.   Past Medical History:  Diagnosis Date  . HYPERLIPIDEMIA-MIXED 11/07/2008   Qualifier: Diagnosis of  By: Mare Ferrari, RMA, Sherri    . 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 Paroxysmal supraventricular tachycardia ([place])   . Personal history of tobacco use, presenting hazards to health   . SEIZURE DISORDER 04/04/2009   Qualifier: Diagnosis of  By: Rayann Heman, MD, Jeneen Rinks    . Seizure disorder ([place])    followed by Dr Erling Cruz  . Sinoatrial node dysfunction (HCC)    with syncope, s/p PPM (SJM)  . SYNCOPE 11/13/2008   Qualifier: Diagnosis of  By: Domenic Polite, MD, Phillips Hay    ROS- all systems are reviewed and negative except as per HPI above  Current Outpatient Medications  Medication Sig Dispense Refill  . acetaminophen (TYLENOL) 325 MG tablet Take 325 mg by mouth every 6 (six) hours as needed for mild pain.    Marland Kitchen alendronate (FOSAMAX) 70 MG tablet Take 70 mg by mouth once a week.    Marland Kitchen amLODipine (NORVASC) 2.5 MG tablet Take 2.5 mg by mouth daily.    . carbamazepine (TEGRETOL) 200 MG tablet Take 200 mg by mouth daily.    Marland Kitchen donepezil (ARICEPT) 10 MG tablet Take 1 tablet by mouth daily.  1  . hydrochlorothiazide (HYDRODIURIL) 12.5 MG tablet Take 12.5 mg by mouth daily.    Marland Kitchen levETIRAcetam (KEPPRA) 1000 MG tablet Take 1,000 mg by mouth 2 (two) times daily.    . Multiple Vitamins-Minerals (MULTIVITAMIN WITH MINERALS) tablet Take 1 tablet by mouth daily.    . polyethylene glycol powder (GLYCOLAX/MIRALAX) powder Take 17 g by mouth  daily as needed.  6   No current facility-administered medications for this visit.    Physical Exam: Vitals:   06/13/20 1031  BP: 126/70  Pulse: 74  SpO2: 98%  Weight: 106 lb (48.1 kg)  Height: 5' (1.524 m)    GEN- The patient is well appearing, alert and oriented x 3 today.   Head- normocephalic, atraumatic Eyes-  Sclera clear, conjunctiva pink Ears- hearing intact Oropharynx- clear Lungs-  normal work of breathing Chest- pacemaker pocket is well healed Heart- Regular rate and rhythm  GI- soft  Extremities- no clubbing, cyanosis, or edema  Pacemaker interrogation- reviewed in detail today,  See PACEART report   Assessment and Plan:  1. Symptomatic sinus bradycardia  Normal pacemaker function See Pace Art report No changes today she is not device dependant today She is approaching ERI. Risks, benefits, and alternatives to PPM pulse generator replacement were discussed in detail today with patient and her caregiver.  The patient and caregiver understands that risks include but are not limited to bleeding, infection, pneumothorax, perforation, tamponade, vascular damage, renal failure, MI, stroke, death, damage to his existing leads, and lead dislodgement and wishes to proceed once ERI. We will continue monthly PPM remotes.   Once ERI, ok to schedule generator change without an additional office visit required.  2. HTN Stable No change required today  3. SVT Well controlled minimally symptomatic Not  interested in ablation  Risks, benefits and potential toxicities for medications prescribed and/or refilled reviewed with patient today.    Thompson Grayer MD, [facility] 06/13/2020 11:03 AM

## 2020-06-17 ENCOUNTER — Ambulatory Visit: Payer: Medicare HMO

## 2020-06-18 LAB — CUP PACEART REMOTE DEVICE CHECK
Battery Remaining Longevity: 4 mo
Battery Remaining Percentage: 3 %
Battery Voltage: 2.65 V
Brady Statistic AP VP Percent: 0 %
Brady Statistic AP VS Percent: 1.1 %
Brady Statistic AS VP Percent: 0 %
Brady Statistic AS VS Percent: 99 %
Brady Statistic RA Percent Paced: 1 %
Brady Statistic RV Percent Paced: 0 %
Date Time Interrogation Session: 20220405022341
Implantable Lead Implant Date: 20110128
Implantable Lead Implant Date: 20110128
Implantable Lead Location: 753859
Implantable Lead Location: 753860
Implantable Lead Model: 1948
Implantable Pulse Generator Implant Date: 20110128
Lead Channel Impedance Value: 400 Ohm
Lead Channel Impedance Value: 630 Ohm
Lead Channel Pacing Threshold Amplitude: 0.5 V
Lead Channel Pacing Threshold Amplitude: 0.75 V
Lead Channel Pacing Threshold Pulse Width: 0.4 ms
Lead Channel Pacing Threshold Pulse Width: 0.6 ms
Lead Channel Sensing Intrinsic Amplitude: 3.3 mV
Lead Channel Sensing Intrinsic Amplitude: 9.5 mV
Lead Channel Setting Pacing Amplitude: 2 V
Lead Channel Setting Pacing Amplitude: 2.5 V
Lead Channel Setting Pacing Pulse Width: 0.4 ms
Lead Channel Setting Sensing Sensitivity: 2 mV
Pulse Gen Model: 2210
Pulse Gen Serial Number: 7096923

## 2020-06-19 ENCOUNTER — Telehealth: Payer: Self-pay

## 2020-06-19 NOTE — Telephone Encounter (Signed)
Monthly battery checks increased in [employer] and [employer]. Attempted to call patient to advise. No answer, LMOVM.

## 2020-06-20 NOTE — Telephone Encounter (Signed)
Patient's daughter returned call. She requested to please call her at [place].

## 2020-06-20 NOTE — Telephone Encounter (Signed)
Patient's daughter is returning call. She states she is at work and she requested a voice message if she is not available when her call is returned. She states we can also try calling her work phone, [phone].

## 2020-06-20 NOTE — Telephone Encounter (Signed)
Contacted patient's daughter (DPR) Rhonda Hill at [phone] to advise her that monthly battery checks had been scheduled for her mother's device. As of 06/17/2020 patient's device has 3.2 months until ERI. I advised patient's daughter that when ERI was met, patient would be contacted to set up an appointment to discuss GEN change. Patient's daughter verbalized understanding.

## 2020-07-21 ENCOUNTER — Ambulatory Visit (INDEPENDENT_AMBULATORY_CARE_PROVIDER_SITE_OTHER): Payer: Medicare HMO

## 2020-07-21 DIAGNOSIS — R001 Bradycardia, unspecified: Secondary | ICD-10-CM

## 2020-07-22 LAB — CUP PACEART REMOTE DEVICE CHECK
Battery Remaining Longevity: 1 mo
Battery Remaining Percentage: 0.5 %
Battery Voltage: 2.62 V
Brady Statistic AP VP Percent: 1 %
Brady Statistic AP VS Percent: 1.7 %
Brady Statistic AS VP Percent: 1 %
Brady Statistic AS VS Percent: 95 %
Brady Statistic RA Percent Paced: 1.5 %
Brady Statistic RV Percent Paced: 1 %
Date Time Interrogation Session: 20220509020006
Implantable Lead Implant Date: 20110128
Implantable Lead Implant Date: 20110128
Implantable Lead Location: 753859
Implantable Lead Location: 753860
Implantable Lead Model: 1948
Implantable Pulse Generator Implant Date: 20110128
Lead Channel Impedance Value: 360 Ohm
Lead Channel Impedance Value: 700 Ohm
Lead Channel Pacing Threshold Amplitude: 0.5 V
Lead Channel Pacing Threshold Amplitude: 0.75 V
Lead Channel Pacing Threshold Pulse Width: 0.4 ms
Lead Channel Pacing Threshold Pulse Width: 0.6 ms
Lead Channel Sensing Intrinsic Amplitude: 10.2 mV
Lead Channel Sensing Intrinsic Amplitude: 3.9 mV
Lead Channel Setting Pacing Amplitude: 2 V
Lead Channel Setting Pacing Amplitude: 2.5 V
Lead Channel Setting Pacing Pulse Width: 0.4 ms
Lead Channel Setting Sensing Sensitivity: 2 mV
Pulse Gen Model: 2210
Pulse Gen Serial Number: 7096923

## 2020-08-12 NOTE — Addendum Note (Signed)
Addended by: Cheri Kearns A on: 08/12/2020 09:20 AM   Modules accepted: Level of Service

## 2020-08-12 NOTE — Progress Notes (Signed)
Remote pacemaker transmission.   

## 2020-08-21 ENCOUNTER — Ambulatory Visit (INDEPENDENT_AMBULATORY_CARE_PROVIDER_SITE_OTHER): Payer: Medicare HMO

## 2020-08-21 DIAGNOSIS — I495 Sick sinus syndrome: Secondary | ICD-10-CM

## 2020-08-21 LAB — CUP PACEART REMOTE DEVICE CHECK
Battery Remaining Longevity: 1 mo
Battery Remaining Percentage: 0.5 %
Battery Voltage: 2.62 V
Brady Statistic AP VP Percent: 1 %
Brady Statistic AP VS Percent: 1.4 %
Brady Statistic AS VP Percent: 1 %
Brady Statistic AS VS Percent: 92 %
Brady Statistic RA Percent Paced: 1.3 %
Brady Statistic RV Percent Paced: 1 %
Date Time Interrogation Session: 20220609024153
Implantable Lead Implant Date: 20110128
Implantable Lead Implant Date: 20110128
Implantable Lead Location: 753859
Implantable Lead Location: 753860
Implantable Lead Model: 1948
Implantable Pulse Generator Implant Date: 20110128
Lead Channel Impedance Value: 340 Ohm
Lead Channel Impedance Value: 580 Ohm
Lead Channel Pacing Threshold Amplitude: 0.5 V
Lead Channel Pacing Threshold Amplitude: 0.75 V
Lead Channel Pacing Threshold Pulse Width: 0.4 ms
Lead Channel Pacing Threshold Pulse Width: 0.6 ms
Lead Channel Sensing Intrinsic Amplitude: 4 mV
Lead Channel Sensing Intrinsic Amplitude: 8.7 mV
Lead Channel Setting Pacing Amplitude: 2 V
Lead Channel Setting Pacing Amplitude: 2.5 V
Lead Channel Setting Pacing Pulse Width: 0.4 ms
Lead Channel Setting Sensing Sensitivity: 2 mV
Pulse Gen Model: 2210
Pulse Gen Serial Number: 7096923

## 2020-09-12 NOTE — Progress Notes (Signed)
Remote pacemaker transmission.   

## 2020-09-21 ENCOUNTER — Ambulatory Visit (INDEPENDENT_AMBULATORY_CARE_PROVIDER_SITE_OTHER): Payer: Medicare HMO

## 2020-09-21 DIAGNOSIS — I495 Sick sinus syndrome: Secondary | ICD-10-CM

## 2020-09-22 LAB — CUP PACEART REMOTE DEVICE CHECK
Battery Remaining Longevity: 1 mo
Battery Remaining Percentage: 0.5 %
Battery Voltage: 2.6 V
Brady Statistic AP VP Percent: 1 %
Brady Statistic AP VS Percent: 1.3 %
Brady Statistic AS VP Percent: 1 %
Brady Statistic AS VS Percent: 94 %
Brady Statistic RA Percent Paced: 1.2 %
Brady Statistic RV Percent Paced: 1 %
Date Time Interrogation Session: 20220710025251
Implantable Lead Implant Date: 20110128
Implantable Lead Implant Date: 20110128
Implantable Lead Location: 753859
Implantable Lead Location: 753860
Implantable Lead Model: 1948
Implantable Pulse Generator Implant Date: 20110128
Lead Channel Impedance Value: 360 Ohm
Lead Channel Impedance Value: 600 Ohm
Lead Channel Pacing Threshold Amplitude: 0.5 V
Lead Channel Pacing Threshold Amplitude: 0.75 V
Lead Channel Pacing Threshold Pulse Width: 0.4 ms
Lead Channel Pacing Threshold Pulse Width: 0.6 ms
Lead Channel Sensing Intrinsic Amplitude: 2.2 mV
Lead Channel Sensing Intrinsic Amplitude: 8.1 mV
Lead Channel Setting Pacing Amplitude: 2 V
Lead Channel Setting Pacing Amplitude: 2.5 V
Lead Channel Setting Pacing Pulse Width: 0.4 ms
Lead Channel Setting Sensing Sensitivity: 2 mV
Pulse Gen Model: 2210
Pulse Gen Serial Number: 7096923

## 2020-10-22 ENCOUNTER — Ambulatory Visit (INDEPENDENT_AMBULATORY_CARE_PROVIDER_SITE_OTHER): Payer: Medicare HMO

## 2020-10-22 DIAGNOSIS — I495 Sick sinus syndrome: Secondary | ICD-10-CM

## 2020-10-22 NOTE — Progress Notes (Signed)
Remote pacemaker transmission.   

## 2020-10-23 LAB — CUP PACEART REMOTE DEVICE CHECK
Battery Remaining Longevity: 1 mo
Battery Remaining Percentage: 0.5 %
Battery Voltage: 2.59 V
Brady Statistic AP VP Percent: 1 %
Brady Statistic AP VS Percent: 1.3 %
Brady Statistic AS VP Percent: 1 %
Brady Statistic AS VS Percent: 95 %
Brady Statistic RA Percent Paced: 1.2 %
Brady Statistic RV Percent Paced: 1 %
Date Time Interrogation Session: 20220810030035
Implantable Lead Implant Date: 20110128
Implantable Lead Implant Date: 20110128
Implantable Lead Location: 753859
Implantable Lead Location: 753860
Implantable Lead Model: 1948
Implantable Pulse Generator Implant Date: 20110128
Lead Channel Impedance Value: 380 Ohm
Lead Channel Impedance Value: 580 Ohm
Lead Channel Pacing Threshold Amplitude: 0.5 V
Lead Channel Pacing Threshold Amplitude: 0.75 V
Lead Channel Pacing Threshold Pulse Width: 0.4 ms
Lead Channel Pacing Threshold Pulse Width: 0.6 ms
Lead Channel Sensing Intrinsic Amplitude: 4.7 mV
Lead Channel Sensing Intrinsic Amplitude: 9.3 mV
Lead Channel Setting Pacing Amplitude: 2 V
Lead Channel Setting Pacing Amplitude: 2.5 V
Lead Channel Setting Pacing Pulse Width: 0.4 ms
Lead Channel Setting Sensing Sensitivity: 2 mV
Pulse Gen Model: 2210
Pulse Gen Serial Number: 7096923

## 2020-11-11 ENCOUNTER — Telehealth: Payer: Self-pay

## 2020-11-11 DIAGNOSIS — Z01818 Encounter for other preprocedural examination: Secondary | ICD-10-CM

## 2020-11-11 DIAGNOSIS — R001 Bradycardia, unspecified: Secondary | ICD-10-CM

## 2020-11-11 NOTE — Telephone Encounter (Signed)
PPM battery has reached ERI 11/10/20. Attempted to contact patient to inform and advise gen change. No answer, LMTCB.

## 2020-11-11 NOTE — Telephone Encounter (Signed)
Patient returned call. Advised device has reached RRT and will need in-clinic apt. With Dr. Rayann Heman to discuss gen change. Patient appreciative of call.   Scheduling - please call patient to set up apt with Dr. Rayann Heman.

## 2020-11-13 NOTE — Telephone Encounter (Signed)
Left message to call back  

## 2020-11-14 NOTE — Addendum Note (Signed)
Addended by: Cheri Kearns A on: 11/14/2020 09:04 AM   Modules accepted: Level of Service

## 2020-11-14 NOTE — Progress Notes (Signed)
Remote pacemaker transmission.   

## 2020-11-24 ENCOUNTER — Ambulatory Visit (INDEPENDENT_AMBULATORY_CARE_PROVIDER_SITE_OTHER): Payer: Medicare HMO

## 2020-11-24 DIAGNOSIS — I495 Sick sinus syndrome: Secondary | ICD-10-CM

## 2020-11-26 LAB — CUP PACEART REMOTE DEVICE CHECK
Battery Remaining Longevity: 0 mo
Battery Voltage: 2.59 V
Brady Statistic AP VP Percent: 1 %
Brady Statistic AP VS Percent: 1.3 %
Brady Statistic AS VP Percent: 1 %
Brady Statistic AS VS Percent: 95 %
Brady Statistic RA Percent Paced: 1.2 %
Brady Statistic RV Percent Paced: 1 %
Date Time Interrogation Session: 20220910031735
Implantable Lead Implant Date: 20110128
Implantable Lead Implant Date: 20110128
Implantable Lead Location: 753859
Implantable Lead Location: 753860
Implantable Lead Model: 1948
Implantable Pulse Generator Implant Date: 20110128
Lead Channel Impedance Value: 380 Ohm
Lead Channel Impedance Value: 630 Ohm
Lead Channel Pacing Threshold Amplitude: 0.5 V
Lead Channel Pacing Threshold Amplitude: 0.75 V
Lead Channel Pacing Threshold Pulse Width: 0.4 ms
Lead Channel Pacing Threshold Pulse Width: 0.6 ms
Lead Channel Sensing Intrinsic Amplitude: 12 mV
Lead Channel Sensing Intrinsic Amplitude: 3.1 mV
Lead Channel Setting Pacing Amplitude: 2 V
Lead Channel Setting Pacing Amplitude: 2.5 V
Lead Channel Setting Pacing Pulse Width: 0.4 ms
Lead Channel Setting Sensing Sensitivity: 2 mV
Pulse Gen Model: 2210
Pulse Gen Serial Number: 7096923

## 2020-11-26 NOTE — Telephone Encounter (Signed)
Spoke with the patient and picked procedure date and lab date ([address] office, gave address) will meet with the patient day of labs for instructions and scrub.   Verbalized understanding.

## 2020-11-27 ENCOUNTER — Encounter: Payer: Self-pay | Admitting: *Deleted

## 2020-12-01 ENCOUNTER — Telehealth: Payer: Self-pay | Admitting: Internal Medicine

## 2020-12-01 NOTE — Telephone Encounter (Signed)
Advised it could have been a appointment reminder for the lab on Sept 22 and that I would meet with them that day for instructions and scrub. Verbalized understanding.

## 2020-12-01 NOTE — Telephone Encounter (Signed)
Pt's daughter Lucia Estelle [facility]) is calling because pt was called by heartcare this morning and given some instructions that pt does not understand. Pt called her daughter and asked her to please reach out to our office and try to get the information because pt did not understand information that was given.  Pt's daughter Lucia Estelle  is listed on [place]

## 2020-12-02 NOTE — Progress Notes (Signed)
Remote pacemaker transmission.   

## 2020-12-02 NOTE — Addendum Note (Signed)
Addended by: Cheri Kearns A on: 12/02/2020 02:46 PM   Modules accepted: Level of Service

## 2020-12-04 ENCOUNTER — Other Ambulatory Visit: Payer: Medicare HMO

## 2020-12-11 NOTE — Telephone Encounter (Signed)
Called and advised Rhonda Hill that her mom missed her pre op lab appointment. She asked if she could review her schedule and call me back with a day to reschedule. Expressed importance and timing of these labs.  Verbalized understanding.

## 2020-12-16 ENCOUNTER — Telehealth: Payer: Self-pay | Admitting: Internal Medicine

## 2020-12-16 NOTE — Telephone Encounter (Addendum)
Called Rhonda Hill and advised that her mom has to come in and get labs and procedure instructions including scrub or we will need to cancel her procedure. Reschedule labs for tomorrow (12/17/20).  Verbalized understanding.

## 2020-12-16 NOTE — Telephone Encounter (Signed)
error 

## 2020-12-17 ENCOUNTER — Other Ambulatory Visit: Payer: Self-pay

## 2020-12-17 ENCOUNTER — Other Ambulatory Visit: Payer: Medicare HMO | Admitting: *Deleted

## 2020-12-17 DIAGNOSIS — Z01818 Encounter for other preprocedural examination: Secondary | ICD-10-CM

## 2020-12-17 DIAGNOSIS — R001 Bradycardia, unspecified: Secondary | ICD-10-CM

## 2020-12-17 LAB — CBC WITH DIFFERENTIAL/PLATELET
Basophils Absolute: 0 10*3/uL (ref 0.0–0.2)
Basos: 1 %
EOS (ABSOLUTE): 0.2 10*3/uL (ref 0.0–0.4)
Eos: 4 %
Hematocrit: 38.1 % (ref 34.0–46.6)
Hemoglobin: 13.1 g/dL (ref 11.1–15.9)
Immature Grans (Abs): 0 10*3/uL (ref 0.0–0.1)
Immature Granulocytes: 0 %
Lymphocytes Absolute: 1.8 10*3/uL (ref 0.7–3.1)
Lymphs: 53 %
MCH: 31 pg (ref 26.6–33.0)
MCHC: 34.4 g/dL (ref 31.5–35.7)
MCV: 90 fL (ref 79–97)
Monocytes Absolute: 0.3 10*3/uL (ref 0.1–0.9)
Monocytes: 9 %
Neutrophils Absolute: 1.1 10*3/uL — ABNORMAL LOW (ref 1.4–7.0)
Neutrophils: 33 %
Platelets: 294 10*3/uL (ref 150–450)
RBC: 4.22 x10E6/uL (ref 3.77–5.28)
RDW: 11.6 % — ABNORMAL LOW (ref 11.7–15.4)
WBC: 3.5 10*3/uL (ref 3.4–10.8)

## 2020-12-17 LAB — BASIC METABOLIC PANEL
BUN/Creatinine Ratio: 22 (ref 12–28)
BUN: 19 mg/dL (ref 8–27)
CO2: 27 mmol/L (ref 20–29)
Calcium: 9.5 mg/dL (ref 8.7–10.3)
Chloride: 101 mmol/L (ref 96–106)
Creatinine, Ser: 0.86 mg/dL (ref 0.57–1.00)
Glucose: 85 mg/dL (ref 70–99)
Potassium: 4.3 mmol/L (ref 3.5–5.2)
Sodium: 139 mmol/L (ref 134–144)
eGFR: 67 mL/min/{1.73_m2} (ref >59)

## 2020-12-25 LAB — CUP PACEART REMOTE DEVICE CHECK
Battery Remaining Longevity: 0 mo
Battery Voltage: 2.57 V
Brady Statistic AP VP Percent: 1 %
Brady Statistic AP VS Percent: 1.1 %
Brady Statistic AS VP Percent: 1 %
Brady Statistic AS VS Percent: 96 %
Brady Statistic RA Percent Paced: 1 %
Brady Statistic RV Percent Paced: 1 %
Date Time Interrogation Session: 20221013032653
Implantable Lead Implant Date: 20110128
Implantable Lead Implant Date: 20110128
Implantable Lead Location: 753859
Implantable Lead Location: 753860
Implantable Lead Model: 1948
Implantable Pulse Generator Implant Date: 20110128
Lead Channel Impedance Value: 380 Ohm
Lead Channel Impedance Value: 600 Ohm
Lead Channel Pacing Threshold Amplitude: 0.5 V
Lead Channel Pacing Threshold Amplitude: 0.75 V
Lead Channel Pacing Threshold Pulse Width: 0.4 ms
Lead Channel Pacing Threshold Pulse Width: 0.6 ms
Lead Channel Sensing Intrinsic Amplitude: 3.5 mV
Lead Channel Sensing Intrinsic Amplitude: 7.7 mV
Lead Channel Setting Pacing Amplitude: 2 V
Lead Channel Setting Pacing Amplitude: 2.5 V
Lead Channel Setting Pacing Pulse Width: 0.4 ms
Lead Channel Setting Sensing Sensitivity: 2 mV
Pulse Gen Model: 2210
Pulse Gen Serial Number: 7096923

## 2020-12-25 NOTE — Pre-Procedure Instructions (Signed)
Instructed patient on the following items: Arrival time 0930 Nothing to eat or drink after midnight No meds AM of procedure Responsible person to drive you home and stay with you for 24 hrs Wash with special soap night before and morning of procedure  

## 2020-12-26 ENCOUNTER — Encounter (HOSPITAL_COMMUNITY): Payer: Self-pay | Admitting: Internal Medicine

## 2020-12-26 ENCOUNTER — Ambulatory Visit (HOSPITAL_COMMUNITY)
Admission: RE | Admit: 2020-12-26 | Discharge: 2020-12-26 | Disposition: A | Discharge status: Home / Self Care | Payer: Medicare HMO | Attending: Internal Medicine | Admitting: Internal Medicine

## 2020-12-26 ENCOUNTER — Encounter (HOSPITAL_COMMUNITY)
Admission: RE | Disposition: A | Discharge status: Home / Self Care | Payer: Self-pay | Source: Home / Self Care | Attending: Internal Medicine

## 2020-12-26 ENCOUNTER — Other Ambulatory Visit: Payer: Self-pay

## 2020-12-26 DIAGNOSIS — Z79899 Other long term (current) drug therapy: Secondary | ICD-10-CM | POA: Insufficient documentation

## 2020-12-26 DIAGNOSIS — I495 Sick sinus syndrome: Secondary | ICD-10-CM

## 2020-12-26 DIAGNOSIS — R55 Syncope and collapse: Secondary | ICD-10-CM | POA: Insufficient documentation

## 2020-12-26 DIAGNOSIS — Z45010 Encounter for checking and testing of cardiac pacemaker pulse generator [battery]: Secondary | ICD-10-CM | POA: Diagnosis not present

## 2020-12-26 HISTORY — PX: PPM GENERATOR CHANGEOUT: EP1233

## 2020-12-26 SURGERY — PPM GENERATOR CHANGEOUT

## 2020-12-26 MED ORDER — POVIDONE-IODINE 10 % EX SWAB
2.0000 "application " | Freq: Once | CUTANEOUS | Status: AC
  Administered 2020-12-26: 2 via TOPICAL

## 2020-12-26 MED ORDER — SODIUM CHLORIDE 0.9 % IV SOLN: INTRAVENOUS | Status: DC

## 2020-12-26 MED ORDER — SODIUM CHLORIDE 0.9 % IV SOLN
INTRAVENOUS | Status: AC
  Filled 2020-12-26: qty 2

## 2020-12-26 MED ORDER — SODIUM CHLORIDE 0.9% FLUSH: 3.0000 mL | Freq: Two times a day (BID) | INTRAVENOUS | Status: DC

## 2020-12-26 MED ORDER — SODIUM CHLORIDE 0.9 % IV SOLN
80.0000 mg | INTRAVENOUS | Status: AC
  Administered 2020-12-26: 80 mg

## 2020-12-26 MED ORDER — CEFAZOLIN SODIUM-DEXTROSE 2-4 GM/100ML-% IV SOLN
2.0000 g | INTRAVENOUS | Status: AC
  Administered 2020-12-26: 2 g via INTRAVENOUS

## 2020-12-26 MED ORDER — LIDOCAINE HCL (PF) 1 % IJ SOLN
INTRAMUSCULAR | Status: DC | PRN
  Administered 2020-12-26: 60 mL

## 2020-12-26 MED ORDER — CEFAZOLIN SODIUM-DEXTROSE 2-4 GM/100ML-% IV SOLN
INTRAVENOUS | Status: AC
  Filled 2020-12-26: qty 100

## 2020-12-26 MED ORDER — CHLORHEXIDINE GLUCONATE 4 % EX LIQD
4.0000 "application " | Freq: Once | CUTANEOUS | Status: DC
  Filled 2020-12-26: qty 60

## 2020-12-26 SURGICAL SUPPLY — 4 items
CABLE SURGICAL S-101-97-12 (CABLE) ×2 IMPLANT
PACEMAKER ASSURITY DR-RF (Pacemaker) ×1 IMPLANT
PAD PRO RADIOLUCENT 2001M-C (PAD) ×2 IMPLANT
TRAY PACEMAKER INSERTION (PACKS) ×2 IMPLANT

## 2020-12-26 NOTE — Discharge Instructions (Signed)

## 2020-12-26 NOTE — H&P (Signed)
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  Since last being seen in our clinic, the patient reports doing very well.  Today, she denies symptoms of palpitations, chest pain, shortness of breath,  lower extremity edema, dizziness, presyncope, or syncope. She is accompanied by her caregiver who assists with history today.  The patient is otherwise without complaint today.        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   Paroxysmal supraventricular tachycardia [facility])     Personal history of tobacco use, presenting hazards to health     SEIZURE DISORDER 04/04/2009    Qualifier: Diagnosis of  By: Rayann Heman, MD, Trevar Boehringer     Seizure disorder [facility])      followed by Dr Elliot Dally node dysfunction [facility])      with syncope, s/p PPM (SJM)   SYNCOPE 11/13/2008    Qualifier: Diagnosis of  By: Domenic Polite, MD, Phillips Hay     ROS- all systems are reviewed and negative except as per HPI above         Current Outpatient Medications  Medication Sig Dispense Refill   acetaminophen (TYLENOL) 325 MG tablet Take 325 mg by mouth every 6 (six) hours as needed for mild pain.       alendronate (FOSAMAX) 70 MG tablet Take 70 mg by mouth once a week.       amLODipine (NORVASC) 2.5 MG tablet Take 2.5 mg by mouth daily.       carbamazepine (TEGRETOL) 200 MG tablet Take 200 mg by mouth daily.       donepezil (ARICEPT) 10 MG tablet Take 1 tablet by mouth daily.   1   hydrochlorothiazide (HYDRODIURIL) 12.5 MG tablet Take 12.5 mg by mouth daily.       levETIRAcetam (KEPPRA) 1000 MG tablet Take 1,000 mg by mouth 2 (two) times daily.       Multiple Vitamins-Minerals (MULTIVITAMIN WITH MINERALS) tablet Take 1 tablet by mouth daily.       polyethylene glycol powder (GLYCOLAX/MIRALAX) powder Take 17 g by mouth daily as needed.   6     No current facility-administered medications for this visit.      Physical Exam: Vitals:   12/26/20 0848  BP: (!) 144/62  Pulse: 65  Temp: 97.9 F (36.6 C)  SpO2: 100%     GEN- The patient is well appearing, alert and oriented x 3 today.   Head- normocephalic, atraumatic Eyes-  Sclera clear, conjunctiva pink Ears- hearing intact Oropharynx- clear Lungs-  normal work of breathing Chest- pacemaker pocket is well healed Heart- Regular rate and rhythm  GI- soft  Extremities- no clubbing, cyanosis, or edema      Assessment and Plan:   1. Symptomatic sinus bradycardia   She has reached ERI battery status.  Risks, benefits, and alternatives to PPM pulse generator replacement were discussed in detail today.  The patient understands that risks include but are not limited to bleeding, infection, pneumothorax, perforation, tamponade, vascular damage, renal failure, MI, stroke, death, damage to his existing leads, and lead dislodgement and wishes to proceed.    Thompson Grayer MD, [facility] 12/26/2020 10:19 AM

## 2021-01-07 ENCOUNTER — Other Ambulatory Visit: Payer: Self-pay

## 2021-01-07 ENCOUNTER — Ambulatory Visit (INDEPENDENT_AMBULATORY_CARE_PROVIDER_SITE_OTHER): Payer: Medicare HMO

## 2021-01-07 DIAGNOSIS — I495 Sick sinus syndrome: Secondary | ICD-10-CM | POA: Diagnosis not present

## 2021-01-07 LAB — CUP PACEART INCLINIC DEVICE CHECK
Battery Remaining Longevity: 144 mo
Battery Voltage: 3.05 V
Brady Statistic RA Percent Paced: 0.51 %
Brady Statistic RV Percent Paced: 0 %
Date Time Interrogation Session: 20221026115908
Implantable Lead Implant Date: 20110128
Implantable Lead Implant Date: 20110128
Implantable Lead Location: 753859
Implantable Lead Location: 753860
Implantable Lead Model: 1948
Implantable Pulse Generator Implant Date: 20221014
Lead Channel Impedance Value: 387.5 Ohm
Lead Channel Impedance Value: 612.5 Ohm
Lead Channel Pacing Threshold Amplitude: 0.75 V
Lead Channel Pacing Threshold Amplitude: 0.75 V
Lead Channel Pacing Threshold Pulse Width: 0.4 ms
Lead Channel Pacing Threshold Pulse Width: 0.6 ms
Lead Channel Sensing Intrinsic Amplitude: 2.2 mV
Lead Channel Sensing Intrinsic Amplitude: 6.7 mV
Lead Channel Setting Pacing Amplitude: 2 V
Lead Channel Setting Pacing Amplitude: 2.5 V
Lead Channel Setting Pacing Pulse Width: 0.4 ms
Lead Channel Setting Sensing Sensitivity: 2 mV
Pulse Gen Model: 2272
Pulse Gen Serial Number: 3966374

## 2021-01-07 NOTE — Progress Notes (Signed)
Wound check appointment. Steri-strips removed. Wound without redness or edema. Incision edges approximated, wound well healed. Normal device function. Thresholds, sensing, and impedances consistent with implant measurements. Device programmed at chronic outputs d/t no new leads implanted. Histogram distribution appropriate for patient and level of activity. No mode switches or high ventricular rates noted. Patient educated about wound care. ROV in 3 months with implanting physician.

## 2021-01-07 NOTE — Patient Instructions (Signed)

## 2021-03-27 ENCOUNTER — Ambulatory Visit (INDEPENDENT_AMBULATORY_CARE_PROVIDER_SITE_OTHER): Payer: Medicare PPO

## 2021-03-27 DIAGNOSIS — I495 Sick sinus syndrome: Secondary | ICD-10-CM

## 2021-03-27 LAB — CUP PACEART REMOTE DEVICE CHECK
Battery Remaining Longevity: 124 mo
Battery Remaining Percentage: 95.5 %
Battery Voltage: 3.04 V
Brady Statistic AP VP Percent: 1 %
Brady Statistic AP VS Percent: 1 %
Brady Statistic AS VP Percent: 1 %
Brady Statistic AS VS Percent: 99 %
Brady Statistic RA Percent Paced: 1 %
Brady Statistic RV Percent Paced: 1 %
Date Time Interrogation Session: 20230113020015
Implantable Lead Implant Date: 20110128
Implantable Lead Implant Date: 20110128
Implantable Lead Location: 753859
Implantable Lead Location: 753860
Implantable Lead Model: 1948
Implantable Pulse Generator Implant Date: 20221014
Lead Channel Impedance Value: 380 Ohm
Lead Channel Impedance Value: 600 Ohm
Lead Channel Pacing Threshold Amplitude: 0.75 V
Lead Channel Pacing Threshold Amplitude: 0.75 V
Lead Channel Pacing Threshold Pulse Width: 0.4 ms
Lead Channel Pacing Threshold Pulse Width: 0.6 ms
Lead Channel Sensing Intrinsic Amplitude: 3.2 mV
Lead Channel Sensing Intrinsic Amplitude: 9.2 mV
Lead Channel Setting Pacing Amplitude: 2 V
Lead Channel Setting Pacing Amplitude: 2.5 V
Lead Channel Setting Pacing Pulse Width: 0.4 ms
Lead Channel Setting Sensing Sensitivity: 2 mV
Pulse Gen Model: 2272
Pulse Gen Serial Number: 3966374

## 2021-04-07 NOTE — Progress Notes (Signed)
Remote pacemaker transmission.   

## 2021-04-13 DIAGNOSIS — R030 Elevated blood-pressure reading, without diagnosis of hypertension: Secondary | ICD-10-CM | POA: Diagnosis not present

## 2021-04-13 DIAGNOSIS — Z1329 Encounter for screening for other suspected endocrine disorder: Secondary | ICD-10-CM | POA: Diagnosis not present

## 2021-04-13 DIAGNOSIS — I10 Essential (primary) hypertension: Secondary | ICD-10-CM | POA: Diagnosis not present

## 2021-04-13 DIAGNOSIS — G40301 Generalized idiopathic epilepsy and epileptic syndromes, not intractable, with status epilepticus: Secondary | ICD-10-CM | POA: Diagnosis not present

## 2021-04-13 DIAGNOSIS — D7289 Other specified disorders of white blood cells: Secondary | ICD-10-CM | POA: Diagnosis not present

## 2021-04-13 DIAGNOSIS — G301 Alzheimer's disease with late onset: Secondary | ICD-10-CM | POA: Diagnosis not present

## 2021-04-17 ENCOUNTER — Encounter: Payer: Medicare HMO | Admitting: Internal Medicine

## 2021-04-17 ENCOUNTER — Encounter: Payer: Self-pay | Admitting: Internal Medicine

## 2021-04-17 ENCOUNTER — Ambulatory Visit (INDEPENDENT_AMBULATORY_CARE_PROVIDER_SITE_OTHER): Payer: Medicare PPO | Admitting: Internal Medicine

## 2021-04-17 ENCOUNTER — Other Ambulatory Visit: Payer: Self-pay

## 2021-04-17 VITALS — BP 160/84 | HR 78 | Ht 60.0 in | Wt 104.0 lb

## 2021-04-17 DIAGNOSIS — Z6821 Body mass index (BMI) 21.0-21.9, adult: Secondary | ICD-10-CM | POA: Diagnosis not present

## 2021-04-17 DIAGNOSIS — I495 Sick sinus syndrome: Secondary | ICD-10-CM

## 2021-04-17 DIAGNOSIS — I10 Essential (primary) hypertension: Secondary | ICD-10-CM | POA: Diagnosis not present

## 2021-04-17 DIAGNOSIS — I700 Atherosclerosis of aorta: Secondary | ICD-10-CM | POA: Diagnosis not present

## 2021-04-17 DIAGNOSIS — G301 Alzheimer's disease with late onset: Secondary | ICD-10-CM | POA: Diagnosis not present

## 2021-04-17 DIAGNOSIS — K5901 Slow transit constipation: Secondary | ICD-10-CM | POA: Diagnosis not present

## 2021-04-17 DIAGNOSIS — M810 Age-related osteoporosis without current pathological fracture: Secondary | ICD-10-CM | POA: Diagnosis not present

## 2021-04-17 DIAGNOSIS — G40301 Generalized idiopathic epilepsy and epileptic syndromes, not intractable, with status epilepticus: Secondary | ICD-10-CM | POA: Diagnosis not present

## 2021-04-17 LAB — CUP PACEART INCLINIC DEVICE CHECK
Battery Remaining Longevity: 140 mo
Battery Voltage: 3.04 V
Brady Statistic RA Percent Paced: 0.1 %
Brady Statistic RV Percent Paced: 0 %
Date Time Interrogation Session: 20230203154524
Implantable Lead Implant Date: 20110128
Implantable Lead Implant Date: 20110128
Implantable Lead Location: 753859
Implantable Lead Location: 753860
Implantable Lead Model: 1948
Implantable Pulse Generator Implant Date: 20221014
Lead Channel Impedance Value: 400 Ohm
Lead Channel Impedance Value: 637.5 Ohm
Lead Channel Pacing Threshold Amplitude: 0.75 V
Lead Channel Pacing Threshold Amplitude: 0.75 V
Lead Channel Pacing Threshold Amplitude: 0.75 V
Lead Channel Pacing Threshold Amplitude: 0.75 V
Lead Channel Pacing Threshold Pulse Width: 0.4 ms
Lead Channel Pacing Threshold Pulse Width: 0.4 ms
Lead Channel Pacing Threshold Pulse Width: 0.6 ms
Lead Channel Pacing Threshold Pulse Width: 0.6 ms
Lead Channel Sensing Intrinsic Amplitude: 2.4 mV
Lead Channel Sensing Intrinsic Amplitude: 6.7 mV
Lead Channel Setting Pacing Amplitude: 2 V
Lead Channel Setting Pacing Amplitude: 2.5 V
Lead Channel Setting Pacing Pulse Width: 0.4 ms
Lead Channel Setting Sensing Sensitivity: 2 mV
Pulse Gen Model: 2272
Pulse Gen Serial Number: 3966374

## 2021-04-17 NOTE — Patient Instructions (Signed)
Medication Instructions:  Continue all current medications.  Labwork: none  Testing/Procedures: none  Follow-Up: 1 year - Dr.  Allred   Any Other Special Instructions Will Be Listed Below (If Applicable).   If you need a refill on your cardiac medications before your next appointment, please call your pharmacy.  

## 2021-04-17 NOTE — Progress Notes (Signed)
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  Since her generator change, the patient reports doing very well.   She has noticed some tenderness over her device pocket. Today, she denies symptoms of palpitations, chest pain, shortness of breath,  lower extremity edema, dizziness, presyncope, or syncope.  The patient is otherwise without complaint today.   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/2011   Paroxysmal supraventricular tachycardia [facility])    Personal history of tobacco use, presenting hazards to health    SEIZURE DISORDER 04/04/2009   Qualifier: Diagnosis of  By: Rayann Heman, MD, Tempest Frankland     Seizure disorder [facility])    followed by Dr Elliot Dally node dysfunction [facility])    with syncope, s/p PPM (SJM)   SYNCOPE 11/13/2008   Qualifier: Diagnosis of  By: Domenic Polite, MD, Phillips Hay    Past Surgical History:  Procedure Laterality Date   ABDOMINAL HYSTERECTOMY     PACEMAKER INSERTION  04/11/09   implanted by JA (SJM) for sick sinus   PPM GENERATOR CHANGEOUT N/A 12/26/2020   Procedure: PPM GENERATOR CHANGEOUT;  Surgeon: Thompson Grayer, MD;  Location: [place] [facility];  Service: Cardiovascular;  Laterality: N/A;    ROS- all systems are reviewed and negative except as per HPI above  Current Outpatient Medications  Medication Sig Dispense Refill   acetaminophen (TYLENOL) 325 MG tablet Take 325 mg by mouth every 6 (six) hours as needed for mild pain.     alendronate (FOSAMAX) 70 MG tablet Take 70 mg by mouth once a week.     amLODipine (NORVASC) 2.5 MG tablet Take 2.5 mg by mouth daily.     Calcium Carbonate-Vit D-Min (CALCIUM 1200 PO) Take 1,200 mg by mouth daily.     carbamazepine (TEGRETOL) 200 MG tablet Take 200 mg by mouth 2 (two) times daily.     donepezil  (ARICEPT) 10 MG tablet Take 10 mg by mouth daily.  1   hydrochlorothiazide (HYDRODIURIL) 12.5 MG tablet Take 12.5 mg by mouth daily.     levETIRAcetam (KEPPRA) 1000 MG tablet Take 1,000 mg by mouth 2 (two) times daily.     polyethylene glycol powder (GLYCOLAX/MIRALAX) powder Take 17 g by mouth every other day.  6   No current facility-administered medications for this visit.    Physical Exam: Vitals:   04/17/21 1529  BP: (!) 160/84  Pulse: 78  SpO2: 98%  Weight: 104 lb (47.2 kg)  Height: 5' (1.524 m)    GEN- The patient is well appearing, alert and oriented x 3 today.   Head- normocephalic, atraumatic Eyes-  Sclera clear, conjunctiva pink Ears- hearing intact Oropharynx- clear Lungs-  normal work of breathing Chest- pacemaker pocket is well healed Heart- Regular rate and rhythm  GI- soft, NT, ND, + BS Extremities- no clubbing, cyanosis, or edema  Pacemaker interrogation- reviewed in detail today,  See PACEART report   Assessment and Plan:  1. Symptomatic sinus bradycardia  Normal pacemaker function See Pace Art report No changes today she is not device dependant today  2. HTN Stable No change required today  3. SVT Well controlled Has declined ablation  Return in a year  Thompson Grayer MD, [facility] 04/17/2021 3:39 PM

## 2021-06-08 DIAGNOSIS — G3184 Mild cognitive impairment, so stated: Secondary | ICD-10-CM | POA: Diagnosis not present

## 2021-06-08 DIAGNOSIS — I10 Essential (primary) hypertension: Secondary | ICD-10-CM | POA: Diagnosis not present

## 2021-06-08 DIAGNOSIS — G40211 Localization-related (focal) (partial) symptomatic epilepsy and epileptic syndromes with complex partial seizures, intractable, with status epilepticus: Secondary | ICD-10-CM | POA: Diagnosis not present

## 2021-06-15 ENCOUNTER — Other Ambulatory Visit (HOSPITAL_COMMUNITY): Payer: Self-pay | Admitting: Neurology

## 2021-06-15 DIAGNOSIS — G40211 Localization-related (focal) (partial) symptomatic epilepsy and epileptic syndromes with complex partial seizures, intractable, with status epilepticus: Secondary | ICD-10-CM

## 2021-06-26 ENCOUNTER — Ambulatory Visit (INDEPENDENT_AMBULATORY_CARE_PROVIDER_SITE_OTHER): Payer: Medicare HMO

## 2021-06-26 DIAGNOSIS — I495 Sick sinus syndrome: Secondary | ICD-10-CM

## 2021-06-29 LAB — CUP PACEART REMOTE DEVICE CHECK
Battery Remaining Longevity: 121 mo
Battery Remaining Percentage: 95.5 %
Battery Voltage: 3.02 V
Brady Statistic AP VP Percent: 1 %
Brady Statistic AP VS Percent: 1 %
Brady Statistic AS VP Percent: 1 %
Brady Statistic AS VS Percent: 99 %
Brady Statistic RA Percent Paced: 1 %
Brady Statistic RV Percent Paced: 1 %
Date Time Interrogation Session: 20230415113406
Implantable Lead Implant Date: 20110128
Implantable Lead Implant Date: 20110128
Implantable Lead Location: 753859
Implantable Lead Location: 753860
Implantable Lead Model: 1948
Implantable Pulse Generator Implant Date: 20221014
Lead Channel Impedance Value: 390 Ohm
Lead Channel Impedance Value: 610 Ohm
Lead Channel Pacing Threshold Amplitude: 0.75 V
Lead Channel Pacing Threshold Amplitude: 0.75 V
Lead Channel Pacing Threshold Pulse Width: 0.4 ms
Lead Channel Pacing Threshold Pulse Width: 0.6 ms
Lead Channel Sensing Intrinsic Amplitude: 4.1 mV
Lead Channel Sensing Intrinsic Amplitude: 7.6 mV
Lead Channel Setting Pacing Amplitude: 2 V
Lead Channel Setting Pacing Amplitude: 2.5 V
Lead Channel Setting Pacing Pulse Width: 0.4 ms
Lead Channel Setting Sensing Sensitivity: 2 mV
Pulse Gen Model: 2272
Pulse Gen Serial Number: 3966374

## 2021-07-07 ENCOUNTER — Emergency Department (HOSPITAL_COMMUNITY)
Admission: EM | Admit: 2021-07-07 | Discharge: 2021-07-08 | Disposition: A | Discharge status: Home / Self Care | Payer: Medicare HMO | Attending: Emergency Medicine | Admitting: Emergency Medicine

## 2021-07-07 DIAGNOSIS — R0789 Other chest pain: Secondary | ICD-10-CM | POA: Diagnosis present

## 2021-07-07 DIAGNOSIS — Z79899 Other long term (current) drug therapy: Secondary | ICD-10-CM | POA: Diagnosis not present

## 2021-07-07 NOTE — ED Triage Notes (Signed)
P c/o chest pain times three days with L shoulder pain. Pt also endorses SOB worsening today. Pt has hx of pacemaker ?

## 2021-07-08 ENCOUNTER — Emergency Department (HOSPITAL_COMMUNITY): Payer: Medicare HMO

## 2021-07-08 LAB — BASIC METABOLIC PANEL
Anion gap: 8 (ref 5–15)
BUN: 14 mg/dL (ref 8–23)
CO2: 25 mmol/L (ref 22–32)
Calcium: 8.8 mg/dL — ABNORMAL LOW (ref 8.9–10.3)
Chloride: 97 mmol/L — ABNORMAL LOW (ref 98–111)
Creatinine, Ser: 0.69 mg/dL (ref 0.44–1.00)
GFR, Estimated: >60 mL/min (ref >60)
Glucose, Bld: 102 mg/dL — ABNORMAL HIGH (ref 70–99)
Potassium: 3.2 mmol/L — ABNORMAL LOW (ref 3.5–5.1)
Sodium: 130 mmol/L — ABNORMAL LOW (ref 135–145)

## 2021-07-08 LAB — CBC
HCT: 34 % — ABNORMAL LOW (ref 36.0–46.0)
Hemoglobin: 11.7 g/dL — ABNORMAL LOW (ref 12.0–15.0)
MCH: 31.4 pg (ref 26.0–34.0)
MCHC: 34.4 g/dL (ref 30.0–36.0)
MCV: 91.2 fL (ref 80.0–100.0)
Platelets: 243 10*3/uL (ref 150–400)
RBC: 3.73 MIL/uL — ABNORMAL LOW (ref 3.87–5.11)
RDW: 11.9 % (ref 11.5–15.5)
WBC: 3.4 10*3/uL — ABNORMAL LOW (ref 4.0–10.5)
nRBC: 0 % (ref 0.0–0.2)

## 2021-07-08 LAB — TROPONIN I (HIGH SENSITIVITY)
Troponin I (High Sensitivity): 4 ng/L (ref <18)
Troponin I (High Sensitivity): 4 ng/L (ref <18)

## 2021-07-08 MED ORDER — TRAMADOL HCL 50 MG PO TABS: 50.0000 mg | ORAL_TABLET | Freq: Four times a day (QID) | ORAL | 0 refills | Status: DC | PRN

## 2021-07-08 MED ORDER — HYDROCODONE-ACETAMINOPHEN 5-325 MG PO TABS
1.0000 | ORAL_TABLET | Freq: Once | ORAL | Status: AC
  Administered 2021-07-08: 1 via ORAL
  Filled 2021-07-08: qty 1

## 2021-07-08 NOTE — ED Provider Notes (Signed)
?[facility] ?Provider Note ? ? ?CSN: [id number] ?Arrival date & time: 07/07/21  2318 ? ?  ? ?History ? ?Chief Complaint  ?Patient presents with  ? Chest Pain  ? ? ?Rhonda Hill is a 81 y.o. female. ? 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  Patient presenting today with complaints of chest discomfort.  She describes a 3-week history of left arm pain, then started today with intermittent discomfort to the center of her chest.  This is sharp in nature and not associated with shortness of breath, nausea, diaphoresis.  She denies any fevers, chills, or cough.  She denies having taken anything for the discomfort. ? ?Patient's cardiologist is Dr. Rayann Heman. ? ?The history is provided by the patient.  ? ?  ? ?Home Medications ?Prior to Admission medications   ?Medication Sig Start Date End Date Taking? Authorizing Provider  ?acetaminophen (TYLENOL) 325 MG tablet Take 325 mg by mouth every 6 (six) hours as needed for mild pain.    [provider]  ?alendronate (FOSAMAX) 70 MG tablet Take 70 mg by mouth once a week. 05/07/20   [provider]  ?amLODipine (NORVASC) 2.5 MG tablet Take 2.5 mg by mouth daily. 05/17/20   [provider]  ?Calcium Carbonate-Vit D-Min (CALCIUM 1200 PO) Take 1,200 mg by mouth daily.    [provider]  ?carbamazepine (TEGRETOL) 200 MG tablet Take 200 mg by mouth 2 (two) times daily.    [provider]  ?donepezil (ARICEPT) 10 MG tablet Take 10 mg by mouth daily. 02/16/16   [provider]  ?hydrochlorothiazide (HYDRODIURIL) 12.5 MG tablet Take 12.5 mg by mouth daily. 04/10/20   [provider]  ?levETIRAcetam (KEPPRA) 1000 MG tablet Take 1,000 mg by mouth 2 (two) times daily. 05/20/20   [provider]  ?polyethylene glycol powder (GLYCOLAX/MIRALAX) powder Take 17 g by mouth every other day. 01/09/16    [provider]  ?   ? ?Allergies    ?Patient has no known allergies.   ? ?Review of Systems   ?Review of Systems  ?All other systems reviewed and are negative. ? ?Physical Exam ?Updated Vital Signs ?BP 136/81   Pulse 63   Temp 98.7 ?F (37.1 ?C) (Oral)   Resp 16   Ht 5' (1.524 m)   SpO2 98%   BMI 20.31 kg/m?  ?Physical Exam ?Vitals and nursing note reviewed.  ?Constitutional:   ?   General: She is not in acute distress. ?   Appearance: She is well-developed. She is not diaphoretic.  ?HENT:  ?   Head: Normocephalic and atraumatic.  ?Cardiovascular:  ?   Rate and Rhythm: Normal rate and regular rhythm.  ?   Heart sounds: No murmur heard. ?  No friction rub. No gallop.  ?Pulmonary:  ?   Effort: Pulmonary effort is normal. No respiratory distress.  ?   Breath sounds: Normal breath sounds. No wheezing.  ?   Comments: There is tenderness to palpation to the anterior chest wall.  This reproduces her symptoms. ?Abdominal:  ?   General: Bowel sounds are normal. There is no distension.  ?   Palpations: Abdomen is soft.  ?   Tenderness: There is no abdominal tenderness.  ?Musculoskeletal:     ?   General: Normal range of motion.  ?   Cervical back: Normal range of motion and neck supple.  ?   Right lower  leg: No tenderness. No edema.  ?   Left lower leg: No tenderness. No edema.  ?Skin: ?   General: Skin is warm and dry.  ?Neurological:  ?   General: No focal deficit present.  ?   Mental Status: She is alert and oriented to person, place, and time.  ? ? ?ED Results / Procedures / Treatments   ?Labs ?(all labs ordered are listed, but only abnormal results are displayed) ?Labs Reviewed  ?BASIC METABOLIC PANEL - Abnormal; Notable for the following components:  ?    Result Value  ? Sodium 130 (*)   ? Potassium 3.2 (*)   ? Chloride 97 (*)   ? Glucose, Bld 102 (*)   ? Calcium 8.8 (*)   ? All other components within normal limits  ?CBC - Abnormal; Notable for the following components:  ? WBC 3.4 (*)   ? RBC 3.73 (*)    ? Hemoglobin 11.7 (*)   ? HCT 34.0 (*)   ? All other components within normal limits  ?TROPONIN I (HIGH SENSITIVITY)  ?TROPONIN I (HIGH SENSITIVITY)  ? ? ?EKG ?EKG Interpretation ? ?Date/Time:  Tuesday July 07 2021 23:44:41 EDT ?Ventricular Rate:  66 ?PR Interval:  153 ?QRS Duration: 94 ?QT Interval:  445 ?QTC Calculation: 467 ?R Axis:   -17 ?Text Interpretation: Sinus rhythm Borderline left axis deviation Borderline T abnormalities, anterior leads No significant change since 04/11/2009 Confirmed by Veryl Speak [phone]) on 07/07/2021 11:55:57 PM ? ?Radiology ?DG Chest 2 View ? ?Result Date: 07/08/2021 ?CLINICAL DATA:  Chest pain short of breath EXAM: CHEST - 2 VIEW COMPARISON:  04/12/2009, chest x-ray 11/23/2018 FINDINGS: Left-sided pacing device as before. No focal opacity or pleural effusion. Cardiomediastinal silhouette within normal limits. No pneumothorax. IMPRESSION: No active cardiopulmonary disease. Electronically Signed   By: Donavan Foil M.D.   On: 07/08/2021 00:40   ? ?Procedures ?Procedures  ? ? ?Medications Ordered in ED ?Medications  ?HYDROcodone-acetaminophen (NORCO/VICODIN) 5-325 MG per tablet 1 tablet (has no administration in time range)  ? ? ?ED Course/ Medical Decision Making/ A&P ? ?Patient is an 81 year old female with history of pacemaker placement presenting with complaints of chest discomfort.  Her symptoms seem musculoskeletal in nature as they are reproducible with palpation and sharp in nature.  Her work-up is unremarkable including troponin x2, remainder of laboratory studies, and EKG. ? ?Patient feeling better after receiving hydrocodone in the ER and I feel as though can be discharged safely.  To return as needed if symptoms worsen. ? ?Final Clinical Impression(s) / ED Diagnoses ?Final diagnoses:  ?None  ? ? ?Rx / DC Orders ?ED Discharge Orders   ? ? None  ? ?  ? ? ?  ?Veryl Speak, MD ?07/08/21 [phone] ? ?

## 2021-07-08 NOTE — Discharge Instructions (Signed)
Begin taking tramadol as prescribed as needed for pain. ? ?Continue other medications as previously prescribed. ? ?Follow-up with primary doctor in the next few days, and return to the ER if symptoms significantly worsen or change. ?

## 2021-07-08 NOTE — ED Notes (Signed)
Patient transported to X-ray 

## 2021-07-12 ENCOUNTER — Telehealth (HOSPITAL_COMMUNITY): Payer: Self-pay | Admitting: Emergency Medicine

## 2021-07-12 MED ORDER — TRAMADOL HCL 50 MG PO TABS: 50.0000 mg | ORAL_TABLET | Freq: Four times a day (QID) | ORAL | 0 refills | Status: DC | PRN

## 2021-07-12 NOTE — Telephone Encounter (Signed)
Patient calls in stating that her prescription for tramadol did not go through.  I will resend this to her pharmacy. ?

## 2021-07-14 NOTE — Progress Notes (Signed)
Remote pacemaker transmission.   

## 2021-07-16 ENCOUNTER — Emergency Department (HOSPITAL_COMMUNITY): Payer: Medicare HMO

## 2021-07-16 ENCOUNTER — Emergency Department (HOSPITAL_COMMUNITY)
Admission: EM | Admit: 2021-07-16 | Discharge: 2021-07-16 | Disposition: A | Discharge status: Home / Self Care | Payer: Medicare HMO | Attending: Emergency Medicine | Admitting: Emergency Medicine

## 2021-07-16 ENCOUNTER — Encounter (HOSPITAL_COMMUNITY): Payer: Self-pay

## 2021-07-16 ENCOUNTER — Other Ambulatory Visit: Payer: Self-pay

## 2021-07-16 DIAGNOSIS — R0789 Other chest pain: Secondary | ICD-10-CM | POA: Diagnosis not present

## 2021-07-16 DIAGNOSIS — M5412 Radiculopathy, cervical region: Secondary | ICD-10-CM | POA: Diagnosis not present

## 2021-07-16 DIAGNOSIS — E871 Hypo-osmolality and hyponatremia: Secondary | ICD-10-CM | POA: Diagnosis not present

## 2021-07-16 DIAGNOSIS — Z79899 Other long term (current) drug therapy: Secondary | ICD-10-CM | POA: Diagnosis not present

## 2021-07-16 DIAGNOSIS — M542 Cervicalgia: Secondary | ICD-10-CM | POA: Diagnosis present

## 2021-07-16 LAB — BASIC METABOLIC PANEL
Anion gap: 8 (ref 5–15)
BUN: 10 mg/dL (ref 8–23)
CO2: 27 mmol/L (ref 22–32)
Calcium: 8.7 mg/dL — ABNORMAL LOW (ref 8.9–10.3)
Chloride: 89 mmol/L — ABNORMAL LOW (ref 98–111)
Creatinine, Ser: 0.69 mg/dL (ref 0.44–1.00)
GFR, Estimated: >60 mL/min (ref >60)
Glucose, Bld: 89 mg/dL (ref 70–99)
Potassium: 3.2 mmol/L — ABNORMAL LOW (ref 3.5–5.1)
Sodium: 124 mmol/L — ABNORMAL LOW (ref 135–145)

## 2021-07-16 LAB — TROPONIN I (HIGH SENSITIVITY): Troponin I (High Sensitivity): 4 ng/L (ref <18)

## 2021-07-16 LAB — CBC
HCT: 33.6 % — ABNORMAL LOW (ref 36.0–46.0)
Hemoglobin: 11.7 g/dL — ABNORMAL LOW (ref 12.0–15.0)
MCH: 31.7 pg (ref 26.0–34.0)
MCHC: 34.8 g/dL (ref 30.0–36.0)
MCV: 91.1 fL (ref 80.0–100.0)
Platelets: 271 10*3/uL (ref 150–400)
RBC: 3.69 MIL/uL — ABNORMAL LOW (ref 3.87–5.11)
RDW: 11.7 % (ref 11.5–15.5)
WBC: 2.7 10*3/uL — ABNORMAL LOW (ref 4.0–10.5)
nRBC: 0 % (ref 0.0–0.2)

## 2021-07-16 MED ORDER — DEXAMETHASONE SODIUM PHOSPHATE 10 MG/ML IJ SOLN
10.0000 mg | Freq: Once | INTRAMUSCULAR | Status: AC
  Administered 2021-07-16: 10 mg via INTRAMUSCULAR
  Filled 2021-07-16: qty 1

## 2021-07-16 MED ORDER — OXYCODONE-ACETAMINOPHEN 5-325 MG PO TABS
2.0000 | ORAL_TABLET | Freq: Once | ORAL | Status: AC
  Administered 2021-07-16: 2 via ORAL
  Filled 2021-07-16: qty 2

## 2021-07-16 MED ORDER — MORPHINE SULFATE (PF) 4 MG/ML IV SOLN
4.0000 mg | Freq: Once | INTRAVENOUS | Status: AC
  Administered 2021-07-16: 4 mg via INTRAVENOUS
  Filled 2021-07-16: qty 1

## 2021-07-16 MED ORDER — SODIUM CHLORIDE 0.9 % IV BOLUS
1000.0000 mL | Freq: Once | INTRAVENOUS | Status: AC
  Administered 2021-07-16: 1000 mL via INTRAVENOUS

## 2021-07-16 NOTE — ED Provider Notes (Signed)
?[facility] ?Provider Note ? ? ?CSN: [id number] ?Arrival date & time: 07/16/21  1313 ? ?  ? ?History ? ?Chief Complaint  ?Patient presents with  ? Chest Pain  ? ? ?Rhonda Hill is a 81 y.o. female. ? ? ?Chest Pain ? ?This patient is a 81 year old female, she has a known history of multiple medical problems including hypertension on hydrochlorothiazide, she is already taking carbamazepine and levetiracetam for seizures, she is on Aricept, she is on Fosamax and she takes tramadol as needed for pain which was prescribed recently. ? ?She presents again to the hospital today complaining of increasing pain in her left arm going from her shoulder down to her elbow, this is a burning intense sensation that gets much worse with touching the arm or movement of the arm.  She has pain in her left neck and has some discomfort in the left upper chest as well as the left rhomboid and parascapular muscles.  She has no numbness or weakness in the left upper extremity and has no symptoms in her legs.  She has no coughing and has no exertional symptoms.  She has no known history of cardiac disease ? ?Home Medications ?Prior to Admission medications   ?Medication Sig Start Date End Date Taking? Authorizing Provider  ?acetaminophen (TYLENOL) 325 MG tablet Take 325 mg by mouth every 6 (six) hours as needed for mild pain.    [provider]  ?alendronate (FOSAMAX) 70 MG tablet Take 70 mg by mouth once a week. 05/07/20   [provider]  ?amLODipine (NORVASC) 2.5 MG tablet Take 2.5 mg by mouth daily. 05/17/20   [provider]  ?Calcium Carbonate-Vit D-Min (CALCIUM 1200 PO) Take 1,200 mg by mouth daily.    [provider]  ?carbamazepine (TEGRETOL) 200 MG tablet Take 200 mg by mouth 2 (two) times daily.    [provider]  ?donepezil (ARICEPT) 10 MG tablet Take 10 mg by mouth daily. 02/16/16   [provider]  ?hydrochlorothiazide (HYDRODIURIL) 12.5 MG tablet Take  12.5 mg by mouth daily. 04/10/20   [provider]  ?levETIRAcetam (KEPPRA) 1000 MG tablet Take 1,000 mg by mouth 2 (two) times daily. 05/20/20   [provider]  ?polyethylene glycol powder (GLYCOLAX/MIRALAX) powder Take 17 g by mouth every other day. 01/09/16   [provider]  ?traMADol (ULTRAM) 50 MG tablet Take 1 tablet (50 mg total) by mouth every 6 (six) hours as needed. 07/08/21   Veryl Speak, MD  ?traMADol (ULTRAM) 50 MG tablet Take 1 tablet (50 mg total) by mouth every 6 (six) hours as needed. 07/12/21   Veryl Speak, MD  ?   ? ?Allergies    ?Patient has no known allergies.   ? ?Review of Systems   ?Review of Systems  ?Cardiovascular:  Positive for chest pain.  ?All other systems reviewed and are negative. ? ?Physical Exam ?Updated Vital Signs ?BP (!) 149/83   Pulse 70   Temp 97.8 ?F (36.6 ?C) (Oral)   Resp 11   Ht 1.448 m ('4\' 9"'$ )   Wt 48.1 kg   SpO2 100%   BMI 22.94 kg/m?  ?Physical Exam ?Vitals and nursing note reviewed.  ?Constitutional:   ?   General: She is not in acute distress. ?   Appearance: She is well-developed.  ?HENT:  ?   Head: Normocephalic and atraumatic.  ?   Mouth/Throat:  ?   Pharynx: No oropharyngeal exudate.  ?Eyes:  ?  General: No scleral icterus.    ?   Right eye: No discharge.     ?   Left eye: No discharge.  ?   Conjunctiva/sclera: Conjunctivae normal.  ?   Pupils: Pupils are equal, round, and reactive to light.  ?Neck:  ?   Thyroid: No thyromegaly.  ?   Vascular: No JVD.  ?Cardiovascular:  ?   Rate and Rhythm: Normal rate and regular rhythm.  ?   Heart sounds: Normal heart sounds. No murmur heard. ?  No friction rub. No gallop.  ?   Comments: Pacemaker present left upper chest, appears without any redness or tenderness or swelling.  There is normal pulses at the left wrist ?Pulmonary:  ?   Effort: Pulmonary effort is normal. No respiratory distress.  ?   Breath sounds: Normal breath sounds. No wheezing or rales.  ?Abdominal:  ?   General: Bowel  sounds are normal. There is no distension.  ?   Palpations: Abdomen is soft. There is no mass.  ?   Tenderness: There is no abdominal tenderness.  ?Musculoskeletal:     ?   General: Tenderness present. Normal range of motion.  ?   Cervical back: Normal range of motion and neck supple.  ?   Comments: There is tenderness to palpation of the left upper extremity between the neck and the elbow, there is no evidence of swelling, no redness, no rash  ?Lymphadenopathy:  ?   Cervical: No cervical adenopathy.  ?Skin: ?   General: Skin is warm and dry.  ?   Findings: No erythema or rash.  ?   Comments: No rash  ?Neurological:  ?   Mental Status: She is alert.  ?   Coordination: Coordination normal.  ?   Comments: Totally normal strength and sensation in all 4 extremities  ?Psychiatric:     ?   Behavior: Behavior normal.  ? ? ?ED Results / Procedures / Treatments   ?Labs ?(all labs ordered are listed, but only abnormal results are displayed) ?Labs Reviewed  ?BASIC METABOLIC PANEL - Abnormal; Notable for the following components:  ?    Result Value  ? Sodium 124 (*)   ? Potassium 3.2 (*)   ? Chloride 89 (*)   ? Calcium 8.7 (*)   ? All other components within normal limits  ?CBC - Abnormal; Notable for the following components:  ? WBC 2.7 (*)   ? RBC 3.69 (*)   ? Hemoglobin 11.7 (*)   ? HCT 33.6 (*)   ? All other components within normal limits  ?TROPONIN I (HIGH SENSITIVITY)  ? ? ?EKG ?None ? ?Radiology ?DG Chest 2 View ? ?Result Date: 07/16/2021 ?CLINICAL DATA:  Chest pain and shortness of breath. EXAM: CHEST - 2 VIEW COMPARISON:  Chest x-ray dated July 08, 2021. FINDINGS: Unchanged left chest wall pacemaker. Unchanged borderline cardiomegaly. Normal pulmonary vascularity. Chronic, mildly coarsened interstitial markings, likely smoking-related. No focal consolidation, pleural effusion, or pneumothorax. No acute osseous abnormality. IMPRESSION: No active cardiopulmonary disease. Electronically Signed   By: Titus Dubin M.D.    On: 07/16/2021 13:57   ? ?Procedures ?Procedures  ? ? ?Medications Ordered in ED ?Medications  ?oxyCODONE-acetaminophen (PERCOCET/ROXICET) 5-325 MG per tablet 2 tablet (has no administration in time range)  ?sodium chloride 0.9 % bolus 1,000 mL (0 mLs Intravenous Stopped 07/16/21 1718)  ?dexamethasone (DECADRON) injection 10 mg (10 mg Intramuscular Given 07/16/21 1603)  ?morphine (PF) 4 MG/ML injection 4 mg (4 mg Intravenous Given  07/16/21 1601)  ? ? ?ED Course/ Medical Decision Making/ A&P ?  ?                        ?Medical Decision Making ?Amount and/or Complexity of Data Reviewed ?Labs: ordered. ?Radiology: ordered. ? ?Risk ?Prescription drug management. ? ? ?Differential diagnosis is fairly wide but leading the differential would be a cervical radiculopathy, muscular syndrome, less likely to be cardiac in nature given no exertional symptoms and significant tenderness to palpation of the arm.  She is also having some tenderness around the left side of the neck and down into the left rhomboid and parascapular muscles ? ?This patient presents to the ED for concern of Left arm pain, differential diagnosis includes That stated above ? ? ? ?Additional history obtained: ? ?Additional history obtained from Electronic medical record including the most recent ER visit as well as the medications that were prescribed at that visit ?External records from outside source obtained and reviewed including prior w/u - and cardiac notes - had ICD generator change done 10/22. ? ? ?Lab Tests: ? ?I Ordered, and personally interpreted labs.  The pertinent results include:  CBC, BMP and trop - all neg other than low Na - was 130 at last visit 2 weeks ago - now 124.  She is not weak or shaky..  Troponin negative ? ? ?Imaging Studies ordered: ? ?I ordered imaging studies including CXR  ?I independently visualized and interpreted imaging which showed no acute findings - ICD in place ?I agree with the radiologist interpretation ? ? ?Medicines  ordered and prescription drug management: ? ?I ordered medication including morphine and decadron as well as IVF for pain and  for hyponatremia.  ?Reevaluation of the patient after these medicines showed that th

## 2021-07-16 NOTE — Discharge Instructions (Signed)
Please have your family doctor recheck your salt level within 1 week, today it was 124.  You may use some extra salt on your food this week ? ?We have given you a steroid injection which will hopefully help with the pain in your arm, this is likely from a pinched nerve, I do want you to see Dr. Merlene Laughter, please make an appointment to follow-up more closely than your scheduled appointment ? ?If you should develop severe or worsening symptoms come back to the emergency department. ?

## 2021-07-16 NOTE — ED Notes (Signed)
Pt assisted to [facility] and back to bed; pt given socks and a warm blanket ?

## 2021-07-16 NOTE — ED Provider Notes (Signed)
Medical Screening Exam initiated and feel Pt stable for completion of MSE by another provider ? ?Patient seen in the triage area, has had left-sided arm pain ongoing for a couple of weeks, had recently been seen in the emergency department, that was on April 25, EKG and work-up was unremarkable, the patient states the pain radiates into the back of her shoulder the left upper chest but mostly from her shoulder to her elbow, it is much worse when it is touched and is not associated with any numbness or weakness.  She has not been seen by her family doctor. ? ?EKG unremarkable showing sinus rhythm, nonspecific T waves, no ST elevation or depression, no arrhythmia. ? ?Labs pending, may be cervical radiculopathy ? ?  ?Noemi Chapel, MD ?07/16/21 1454 ? ?

## 2021-07-16 NOTE — ED Triage Notes (Signed)
Pt reports cp and sob.  Reports pain radiates to L shoulder and arm.  No sob noted.  Resp even and unlabored.  Skin warm and dry.  Reports pain similar 2 weeks ago and was seen then started again this past week.  ?

## 2021-09-25 ENCOUNTER — Ambulatory Visit (INDEPENDENT_AMBULATORY_CARE_PROVIDER_SITE_OTHER): Payer: Medicare HMO

## 2021-09-25 DIAGNOSIS — I495 Sick sinus syndrome: Secondary | ICD-10-CM

## 2021-09-28 LAB — CUP PACEART REMOTE DEVICE CHECK
Battery Remaining Longevity: 118 mo
Battery Remaining Percentage: 95.5 %
Battery Voltage: 3.02 V
Brady Statistic AP VP Percent: 1 %
Brady Statistic AP VS Percent: 1 %
Brady Statistic AS VP Percent: 1 %
Brady Statistic AS VS Percent: 99 %
Brady Statistic RA Percent Paced: 1 %
Brady Statistic RV Percent Paced: 1 %
Date Time Interrogation Session: 20230716031507
Implantable Lead Implant Date: 20110128
Implantable Lead Implant Date: 20110128
Implantable Lead Location: 753859
Implantable Lead Location: 753860
Implantable Lead Model: 1948
Implantable Pulse Generator Implant Date: 20221014
Lead Channel Impedance Value: 400 Ohm
Lead Channel Impedance Value: 630 Ohm
Lead Channel Pacing Threshold Amplitude: 0.75 V
Lead Channel Pacing Threshold Amplitude: 0.75 V
Lead Channel Pacing Threshold Pulse Width: 0.4 ms
Lead Channel Pacing Threshold Pulse Width: 0.6 ms
Lead Channel Sensing Intrinsic Amplitude: 3.1 mV
Lead Channel Sensing Intrinsic Amplitude: 7.3 mV
Lead Channel Setting Pacing Amplitude: 2 V
Lead Channel Setting Pacing Amplitude: 2.5 V
Lead Channel Setting Pacing Pulse Width: 0.4 ms
Lead Channel Setting Sensing Sensitivity: 2 mV
Pulse Gen Model: 2272
Pulse Gen Serial Number: 3966374

## 2021-10-08 NOTE — Progress Notes (Signed)
Remote pacemaker transmission.   

## 2021-12-25 ENCOUNTER — Ambulatory Visit (INDEPENDENT_AMBULATORY_CARE_PROVIDER_SITE_OTHER): Payer: Medicare HMO

## 2021-12-25 DIAGNOSIS — I495 Sick sinus syndrome: Secondary | ICD-10-CM | POA: Diagnosis not present

## 2021-12-29 LAB — CUP PACEART REMOTE DEVICE CHECK
Battery Remaining Longevity: 116 mo
Battery Remaining Percentage: 95 %
Battery Voltage: 3.02 V
Brady Statistic AP VP Percent: 1 %
Brady Statistic AP VS Percent: 1 %
Brady Statistic AS VP Percent: 1 %
Brady Statistic AS VS Percent: 99 %
Brady Statistic RA Percent Paced: 1 %
Brady Statistic RV Percent Paced: 1 %
Date Time Interrogation Session: 20231017020013
Implantable Lead Implant Date: 20110128
Implantable Lead Implant Date: 20110128
Implantable Lead Location: 753859
Implantable Lead Location: 753860
Implantable Lead Model: 1948
Implantable Pulse Generator Implant Date: 20221014
Lead Channel Impedance Value: 390 Ohm
Lead Channel Impedance Value: 610 Ohm
Lead Channel Pacing Threshold Amplitude: 0.75 V
Lead Channel Pacing Threshold Amplitude: 0.75 V
Lead Channel Pacing Threshold Pulse Width: 0.4 ms
Lead Channel Pacing Threshold Pulse Width: 0.6 ms
Lead Channel Sensing Intrinsic Amplitude: 3.2 mV
Lead Channel Sensing Intrinsic Amplitude: 7 mV
Lead Channel Setting Pacing Amplitude: 2 V
Lead Channel Setting Pacing Amplitude: 2.5 V
Lead Channel Setting Pacing Pulse Width: 0.4 ms
Lead Channel Setting Sensing Sensitivity: 2 mV
Pulse Gen Model: 2272
Pulse Gen Serial Number: 3966374

## 2021-12-30 NOTE — Progress Notes (Signed)
Remote pacemaker transmission.   

## 2022-03-26 ENCOUNTER — Ambulatory Visit (INDEPENDENT_AMBULATORY_CARE_PROVIDER_SITE_OTHER): Payer: Medicare HMO

## 2022-03-26 DIAGNOSIS — I495 Sick sinus syndrome: Secondary | ICD-10-CM

## 2022-03-26 LAB — CUP PACEART REMOTE DEVICE CHECK
Battery Remaining Longevity: 113 mo
Battery Remaining Percentage: 93 %
Battery Voltage: 3.02 V
Brady Statistic AP VP Percent: 1 %
Brady Statistic AP VS Percent: 1 %
Brady Statistic AS VP Percent: 1 %
Brady Statistic AS VS Percent: 99 %
Brady Statistic RA Percent Paced: 1 %
Brady Statistic RV Percent Paced: 1 %
Date Time Interrogation Session: 20240112020014
Implantable Lead Connection Status: 753985
Implantable Lead Connection Status: 753985
Implantable Lead Implant Date: 20110128
Implantable Lead Implant Date: 20110128
Implantable Lead Location: 753859
Implantable Lead Location: 753860
Implantable Lead Model: 1948
Implantable Pulse Generator Implant Date: 20221014
Lead Channel Impedance Value: 390 Ohm
Lead Channel Impedance Value: 590 Ohm
Lead Channel Pacing Threshold Amplitude: 0.75 V
Lead Channel Pacing Threshold Amplitude: 0.75 V
Lead Channel Pacing Threshold Pulse Width: 0.4 ms
Lead Channel Pacing Threshold Pulse Width: 0.6 ms
Lead Channel Sensing Intrinsic Amplitude: 2.6 mV
Lead Channel Sensing Intrinsic Amplitude: 6.8 mV
Lead Channel Setting Pacing Amplitude: 2 V
Lead Channel Setting Pacing Amplitude: 2.5 V
Lead Channel Setting Pacing Pulse Width: 0.4 ms
Lead Channel Setting Sensing Sensitivity: 2 mV
Pulse Gen Model: 2272
Pulse Gen Serial Number: 3966374

## 2022-04-12 NOTE — Progress Notes (Signed)
Remote pacemaker transmission.   

## 2022-06-25 ENCOUNTER — Ambulatory Visit (INDEPENDENT_AMBULATORY_CARE_PROVIDER_SITE_OTHER): Payer: Medicare HMO

## 2022-06-25 DIAGNOSIS — I495 Sick sinus syndrome: Secondary | ICD-10-CM | POA: Diagnosis not present

## 2022-06-25 LAB — CUP PACEART REMOTE DEVICE CHECK
Battery Remaining Longevity: 108 mo
Battery Remaining Percentage: 91 %
Battery Voltage: 3.02 V
Brady Statistic AP VP Percent: 1 %
Brady Statistic AP VS Percent: 1 %
Brady Statistic AS VP Percent: 1 %
Brady Statistic AS VS Percent: 99 %
Brady Statistic RA Percent Paced: 1 %
Brady Statistic RV Percent Paced: 1 %
Date Time Interrogation Session: 20240412032036
Implantable Lead Connection Status: 753985
Implantable Lead Connection Status: 753985
Implantable Lead Implant Date: 20110128
Implantable Lead Implant Date: 20110128
Implantable Lead Location: 753859
Implantable Lead Location: 753860
Implantable Lead Model: 1948
Implantable Pulse Generator Implant Date: 20221014
Lead Channel Impedance Value: 340 Ohm
Lead Channel Impedance Value: 530 Ohm
Lead Channel Pacing Threshold Amplitude: 0.75 V
Lead Channel Pacing Threshold Amplitude: 0.75 V
Lead Channel Pacing Threshold Pulse Width: 0.4 ms
Lead Channel Pacing Threshold Pulse Width: 0.6 ms
Lead Channel Sensing Intrinsic Amplitude: 2.5 mV
Lead Channel Sensing Intrinsic Amplitude: 5.5 mV
Lead Channel Setting Pacing Amplitude: 2 V
Lead Channel Setting Pacing Amplitude: 2.5 V
Lead Channel Setting Pacing Pulse Width: 0.4 ms
Lead Channel Setting Sensing Sensitivity: 2 mV
Pulse Gen Model: 2272
Pulse Gen Serial Number: 3966374

## 2022-07-29 NOTE — Progress Notes (Signed)
Remote pacemaker transmission.   

## 2022-09-24 ENCOUNTER — Ambulatory Visit: Payer: Self-pay

## 2022-09-24 DIAGNOSIS — I495 Sick sinus syndrome: Secondary | ICD-10-CM | POA: Diagnosis not present

## 2022-09-24 LAB — CUP PACEART REMOTE DEVICE CHECK
Battery Remaining Longevity: 106 mo
Battery Remaining Percentage: 89 %
Battery Voltage: 3.02 V
Brady Statistic AP VP Percent: 1 %
Brady Statistic AP VS Percent: 1 %
Brady Statistic AS VP Percent: 1 %
Brady Statistic AS VS Percent: 99 %
Brady Statistic RA Percent Paced: 1 %
Brady Statistic RV Percent Paced: 1 %
Date Time Interrogation Session: 20240712020015
Implantable Lead Connection Status: 753985
Implantable Lead Connection Status: 753985
Implantable Lead Implant Date: 20110128
Implantable Lead Implant Date: 20110128
Implantable Lead Location: 753859
Implantable Lead Location: 753860
Implantable Lead Model: 1948
Implantable Pulse Generator Implant Date: 20221014
Lead Channel Impedance Value: 340 Ohm
Lead Channel Impedance Value: 540 Ohm
Lead Channel Pacing Threshold Amplitude: 0.75 V
Lead Channel Pacing Threshold Amplitude: 0.75 V
Lead Channel Pacing Threshold Pulse Width: 0.4 ms
Lead Channel Pacing Threshold Pulse Width: 0.6 ms
Lead Channel Sensing Intrinsic Amplitude: 1.8 mV
Lead Channel Sensing Intrinsic Amplitude: 6.8 mV
Lead Channel Setting Pacing Amplitude: 2 V
Lead Channel Setting Pacing Amplitude: 2.5 V
Lead Channel Setting Pacing Pulse Width: 0.4 ms
Lead Channel Setting Sensing Sensitivity: 2 mV
Pulse Gen Model: 2272
Pulse Gen Serial Number: 3966374

## 2022-10-08 NOTE — Progress Notes (Signed)
Remote pacemaker transmission.   

## 2022-10-15 ENCOUNTER — Encounter: Payer: Medicare HMO | Admitting: Cardiovascular Disease

## 2022-12-17 ENCOUNTER — Ambulatory Visit: Payer: Medicare HMO | Attending: Cardiovascular Disease | Admitting: Cardiovascular Disease

## 2022-12-17 ENCOUNTER — Encounter: Payer: Self-pay | Admitting: Cardiovascular Disease

## 2022-12-17 VITALS — BP 118/70 | HR 88 | Ht <= 58 in | Wt 101.6 lb

## 2022-12-17 DIAGNOSIS — I495 Sick sinus syndrome: Secondary | ICD-10-CM | POA: Diagnosis not present

## 2022-12-17 DIAGNOSIS — I10 Essential (primary) hypertension: Secondary | ICD-10-CM | POA: Diagnosis not present

## 2022-12-17 LAB — CUP PACEART INCLINIC DEVICE CHECK
Battery Remaining Longevity: 120 mo
Battery Voltage: 3.02 V
Brady Statistic RA Percent Paced: 1.1 %
Brady Statistic RV Percent Paced: 0 %
Date Time Interrogation Session: 20241004160613
Implantable Lead Connection Status: 753985
Implantable Lead Connection Status: 753985
Implantable Lead Implant Date: 20110128
Implantable Lead Implant Date: 20110128
Implantable Lead Location: 753859
Implantable Lead Location: 753860
Implantable Lead Model: 1948
Implantable Pulse Generator Implant Date: 20221014
Lead Channel Impedance Value: 362.5 Ohm
Lead Channel Impedance Value: 575 Ohm
Lead Channel Pacing Threshold Amplitude: 0.5 V
Lead Channel Pacing Threshold Amplitude: 0.5 V
Lead Channel Pacing Threshold Amplitude: 0.5 V
Lead Channel Pacing Threshold Amplitude: 0.5 V
Lead Channel Pacing Threshold Pulse Width: 0.4 ms
Lead Channel Pacing Threshold Pulse Width: 0.4 ms
Lead Channel Pacing Threshold Pulse Width: 0.6 ms
Lead Channel Pacing Threshold Pulse Width: 0.6 ms
Lead Channel Sensing Intrinsic Amplitude: 3.1 mV
Lead Channel Sensing Intrinsic Amplitude: 7.7 mV
Lead Channel Setting Pacing Amplitude: 2 V
Lead Channel Setting Pacing Amplitude: 2.5 V
Lead Channel Setting Pacing Pulse Width: 0.4 ms
Lead Channel Setting Sensing Sensitivity: 2 mV
Pulse Gen Model: 2272
Pulse Gen Serial Number: 3966374

## 2022-12-17 NOTE — Patient Instructions (Signed)
Medication Instructions:  Continue all current medications.  Labwork: none  Testing/Procedures: none  Follow-Up: 1 year   Any Other Special Instructions Will Be Listed Below (If Applicable).  If you need a refill on your cardiac medications before your next appointment, please call your pharmacy.  

## 2022-12-17 NOTE — Progress Notes (Signed)
    PCP: Richardean Chimera, MD   Primary EP:  Maurice Small, MD  Rhonda Hill is a 82 y.o. female who presents today for routine electrophysiology followup.  Since her generator change, the patient reports doing very well.   She has noticed some tenderness over her device pocket. Today, she denies symptoms of palpitations, chest pain, shortness of breath,  lower extremity edema, dizziness, presyncope, or syncope.  The patient is otherwise without complaint today.      Physical Exam: Vitals:   12/17/22 1026  BP: 118/70  Pulse: 88  SpO2: 100%  Weight: 101 lb 9.6 oz (46.1 kg)  Height: 4\' 9"  (1.448 m)    Gen: Appears comfortable, well-nourished CV: RRR, no dependent edema The device site is normal -- no tenderness, edema, drainage, redness, threatened erosion.  Pulm: breathing easily   Pacemaker interrogation- reviewed in detail today,  See PACEART report   Assessment and Plan:  1. Symptomatic sinus bradycardia  Normal pacemaker function See Pace Art report No changes today she is not device dependant today  2. HTN Stable No change required today  3. SVT Well controlled Has declined ablation  Return in a year  Maurice Small, MD  12/17/2022 10:52 AM

## 2022-12-24 ENCOUNTER — Ambulatory Visit (INDEPENDENT_AMBULATORY_CARE_PROVIDER_SITE_OTHER): Payer: Self-pay

## 2022-12-24 DIAGNOSIS — I495 Sick sinus syndrome: Secondary | ICD-10-CM | POA: Diagnosis not present

## 2022-12-28 LAB — CUP PACEART REMOTE DEVICE CHECK
Battery Remaining Longevity: 103 mo
Battery Remaining Percentage: 87 %
Battery Voltage: 3.02 V
Brady Statistic AP VP Percent: 1 %
Brady Statistic AP VS Percent: 4 %
Brady Statistic AS VP Percent: 1 %
Brady Statistic AS VS Percent: 96 %
Brady Statistic RA Percent Paced: 3.6 %
Brady Statistic RV Percent Paced: 1 %
Date Time Interrogation Session: 20241011224713
Implantable Lead Connection Status: 753985
Implantable Lead Connection Status: 753985
Implantable Lead Implant Date: 20110128
Implantable Lead Implant Date: 20110128
Implantable Lead Location: 753859
Implantable Lead Location: 753860
Implantable Lead Model: 1948
Implantable Pulse Generator Implant Date: 20221014
Lead Channel Impedance Value: 360 Ohm
Lead Channel Impedance Value: 530 Ohm
Lead Channel Pacing Threshold Amplitude: 0.5 V
Lead Channel Pacing Threshold Amplitude: 0.5 V
Lead Channel Pacing Threshold Pulse Width: 0.4 ms
Lead Channel Pacing Threshold Pulse Width: 0.6 ms
Lead Channel Sensing Intrinsic Amplitude: 3.4 mV
Lead Channel Sensing Intrinsic Amplitude: 6.5 mV
Lead Channel Setting Pacing Amplitude: 2 V
Lead Channel Setting Pacing Amplitude: 2.5 V
Lead Channel Setting Pacing Pulse Width: 0.4 ms
Lead Channel Setting Sensing Sensitivity: 2 mV
Pulse Gen Model: 2272
Pulse Gen Serial Number: 3966374

## 2023-01-03 NOTE — Progress Notes (Signed)
Remote pacemaker transmission.   

## 2023-03-25 ENCOUNTER — Ambulatory Visit (INDEPENDENT_AMBULATORY_CARE_PROVIDER_SITE_OTHER): Payer: Medicare HMO

## 2023-03-25 DIAGNOSIS — I495 Sick sinus syndrome: Secondary | ICD-10-CM | POA: Diagnosis not present

## 2023-03-26 LAB — CUP PACEART REMOTE DEVICE CHECK
Battery Remaining Longevity: 100 mo
Battery Remaining Percentage: 85 %
Battery Voltage: 3.02 V
Brady Statistic AP VP Percent: 1 %
Brady Statistic AP VS Percent: 1.7 %
Brady Statistic AS VP Percent: 1 %
Brady Statistic AS VS Percent: 98 %
Brady Statistic RA Percent Paced: 1.5 %
Brady Statistic RV Percent Paced: 1 %
Date Time Interrogation Session: 20250110020015
Implantable Lead Connection Status: 753985
Implantable Lead Connection Status: 753985
Implantable Lead Implant Date: 20110128
Implantable Lead Implant Date: 20110128
Implantable Lead Location: 753859
Implantable Lead Location: 753860
Implantable Lead Model: 1948
Implantable Pulse Generator Implant Date: 20221014
Lead Channel Impedance Value: 360 Ohm
Lead Channel Impedance Value: 540 Ohm
Lead Channel Pacing Threshold Amplitude: 0.5 V
Lead Channel Pacing Threshold Amplitude: 0.5 V
Lead Channel Pacing Threshold Pulse Width: 0.4 ms
Lead Channel Pacing Threshold Pulse Width: 0.6 ms
Lead Channel Sensing Intrinsic Amplitude: 2.6 mV
Lead Channel Sensing Intrinsic Amplitude: 6.8 mV
Lead Channel Setting Pacing Amplitude: 2 V
Lead Channel Setting Pacing Amplitude: 2.5 V
Lead Channel Setting Pacing Pulse Width: 0.4 ms
Lead Channel Setting Sensing Sensitivity: 2 mV
Pulse Gen Model: 2272
Pulse Gen Serial Number: 3966374

## 2023-04-29 NOTE — Addendum Note (Signed)
Addended by: Elease Etienne A on: 04/29/2023 03:51 PM   Modules accepted: Orders

## 2023-04-29 NOTE — Progress Notes (Signed)
Remote pacemaker transmission.

## 2023-06-24 ENCOUNTER — Ambulatory Visit (INDEPENDENT_AMBULATORY_CARE_PROVIDER_SITE_OTHER): Payer: Self-pay

## 2023-06-24 DIAGNOSIS — I495 Sick sinus syndrome: Secondary | ICD-10-CM | POA: Diagnosis not present

## 2023-06-25 LAB — CUP PACEART REMOTE DEVICE CHECK
Battery Remaining Longevity: 98 mo
Battery Remaining Percentage: 83 %
Battery Voltage: 3.01 V
Brady Statistic AP VP Percent: 1 %
Brady Statistic AP VS Percent: 1.6 %
Brady Statistic AS VP Percent: 1 %
Brady Statistic AS VS Percent: 98 %
Brady Statistic RA Percent Paced: 1.4 %
Brady Statistic RV Percent Paced: 1 %
Date Time Interrogation Session: 20250411020013
Implantable Lead Connection Status: 753985
Implantable Lead Connection Status: 753985
Implantable Lead Implant Date: 20110128
Implantable Lead Implant Date: 20110128
Implantable Lead Location: 753859
Implantable Lead Location: 753860
Implantable Lead Model: 1948
Implantable Pulse Generator Implant Date: 20221014
Lead Channel Impedance Value: 360 Ohm
Lead Channel Impedance Value: 560 Ohm
Lead Channel Pacing Threshold Amplitude: 0.5 V
Lead Channel Pacing Threshold Amplitude: 0.5 V
Lead Channel Pacing Threshold Pulse Width: 0.4 ms
Lead Channel Pacing Threshold Pulse Width: 0.6 ms
Lead Channel Sensing Intrinsic Amplitude: 2.6 mV
Lead Channel Sensing Intrinsic Amplitude: 6.7 mV
Lead Channel Setting Pacing Amplitude: 2 V
Lead Channel Setting Pacing Amplitude: 2.5 V
Lead Channel Setting Pacing Pulse Width: 0.4 ms
Lead Channel Setting Sensing Sensitivity: 2 mV
Pulse Gen Model: 2272
Pulse Gen Serial Number: 3966374

## 2023-08-01 NOTE — Addendum Note (Signed)
 Addended by: Lott Rouleau A on: 08/01/2023 07:28 AM   Modules accepted: Orders

## 2023-08-01 NOTE — Progress Notes (Signed)
 Remote pacemaker transmission.

## 2023-09-23 ENCOUNTER — Ambulatory Visit (INDEPENDENT_AMBULATORY_CARE_PROVIDER_SITE_OTHER): Payer: Self-pay

## 2023-09-23 DIAGNOSIS — I495 Sick sinus syndrome: Secondary | ICD-10-CM

## 2023-09-24 LAB — CUP PACEART REMOTE DEVICE CHECK
Battery Remaining Longevity: 96 mo
Battery Remaining Percentage: 81 %
Battery Voltage: 3.01 V
Brady Statistic AP VP Percent: 1 %
Brady Statistic AP VS Percent: 1.5 %
Brady Statistic AS VP Percent: 1 %
Brady Statistic AS VS Percent: 98 %
Brady Statistic RA Percent Paced: 1.3 %
Brady Statistic RV Percent Paced: 1 %
Date Time Interrogation Session: 20250711022508
Implantable Lead Connection Status: 753985
Implantable Lead Connection Status: 753985
Implantable Lead Implant Date: 20110128
Implantable Lead Implant Date: 20110128
Implantable Lead Location: 753859
Implantable Lead Location: 753860
Implantable Lead Model: 1948
Implantable Pulse Generator Implant Date: 20221014
Lead Channel Impedance Value: 360 Ohm
Lead Channel Impedance Value: 560 Ohm
Lead Channel Pacing Threshold Amplitude: 0.5 V
Lead Channel Pacing Threshold Amplitude: 0.5 V
Lead Channel Pacing Threshold Pulse Width: 0.4 ms
Lead Channel Pacing Threshold Pulse Width: 0.6 ms
Lead Channel Sensing Intrinsic Amplitude: 3.1 mV
Lead Channel Sensing Intrinsic Amplitude: 6.3 mV
Lead Channel Setting Pacing Amplitude: 2 V
Lead Channel Setting Pacing Amplitude: 2.5 V
Lead Channel Setting Pacing Pulse Width: 0.4 ms
Lead Channel Setting Sensing Sensitivity: 2 mV
Pulse Gen Model: 2272
Pulse Gen Serial Number: 3966374

## 2023-09-28 ENCOUNTER — Ambulatory Visit: Payer: Self-pay | Admitting: Cardiovascular Disease

## 2023-10-22 IMAGING — DX DG CHEST 2V
2 series · 2 of 2 positions shown · non-contrast
Comparison: 04/12/2009, chest x-ray 11/23/2018

CLINICAL DATA: Chest pain short of breath

EXAM:
CHEST - 2 VIEW

[chest lat]
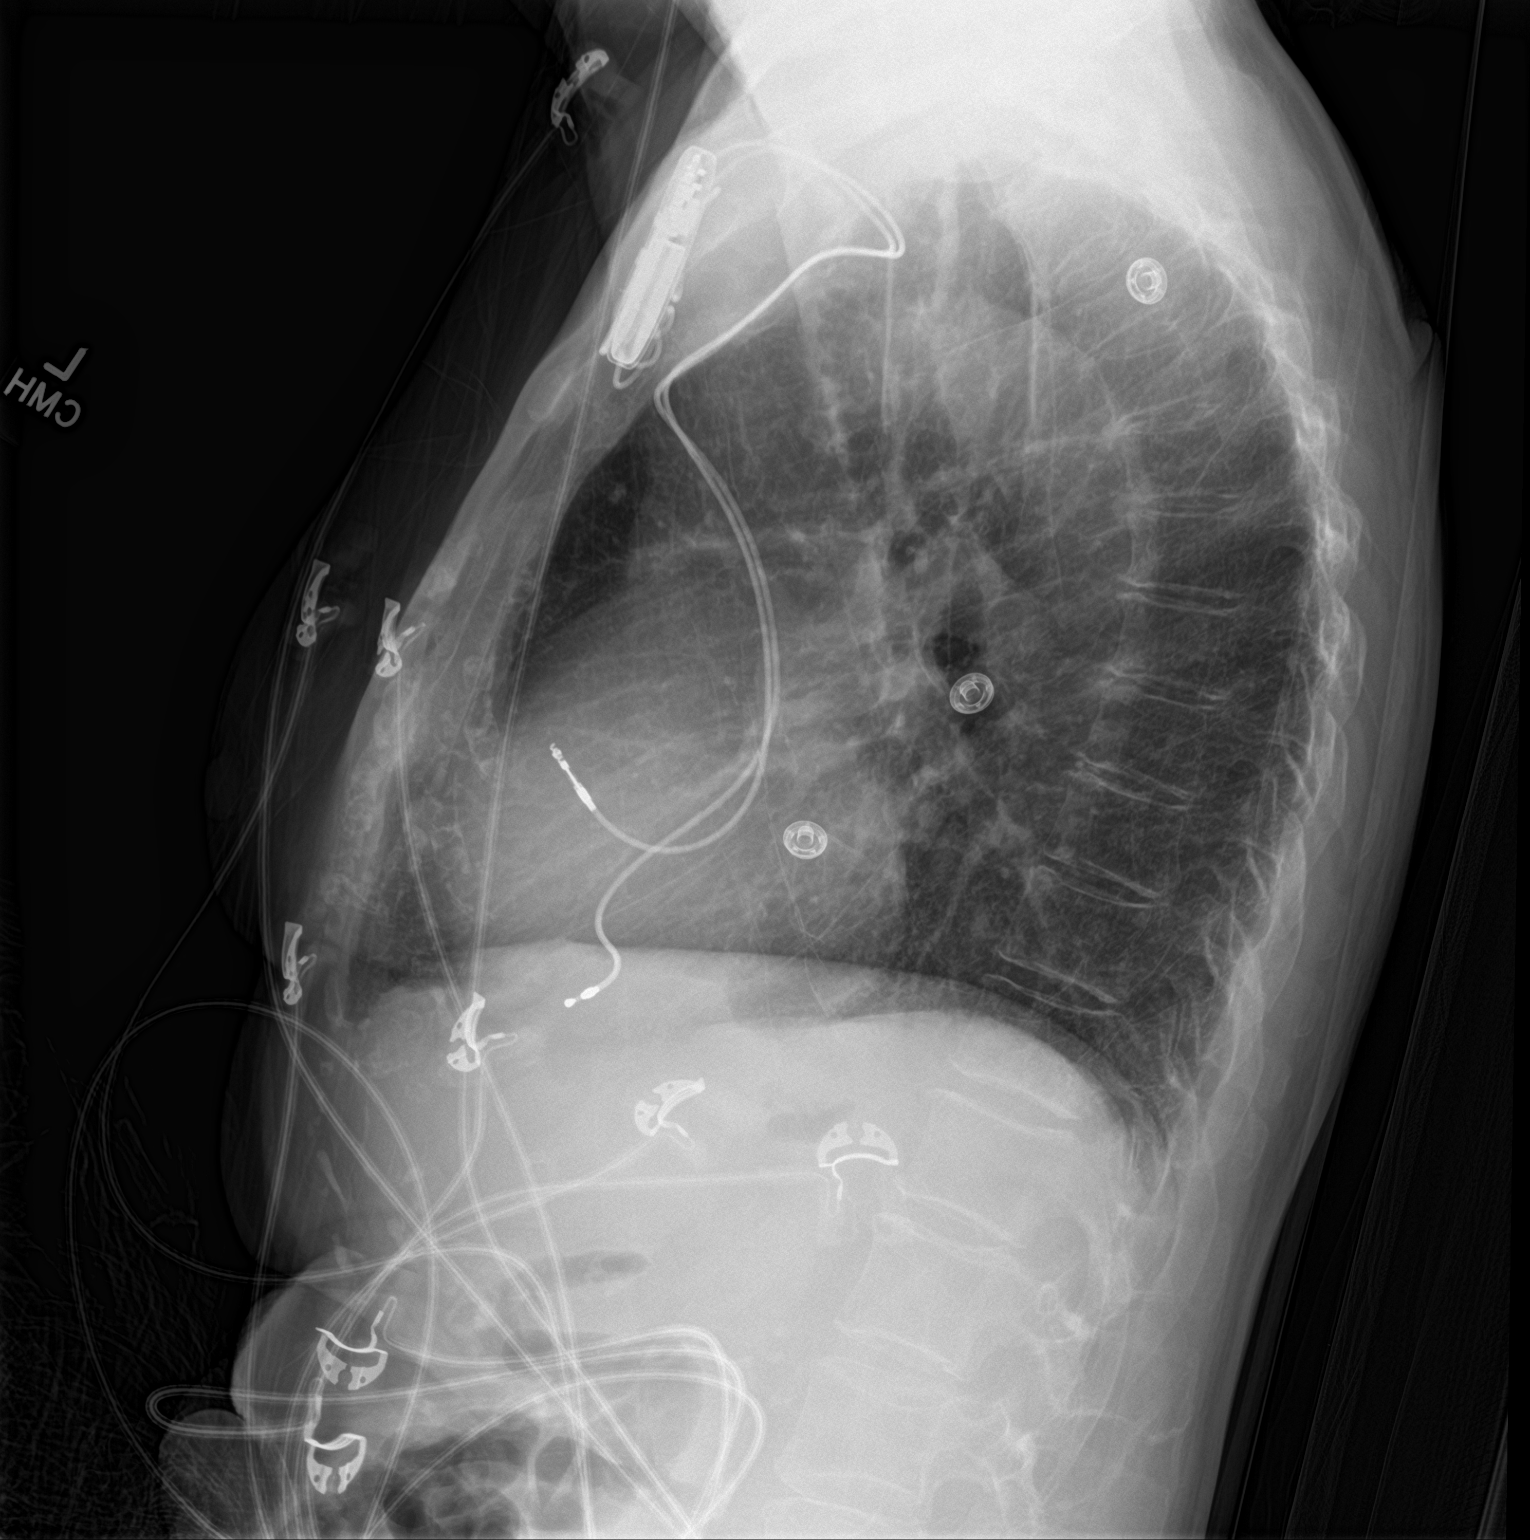

[chest ap]
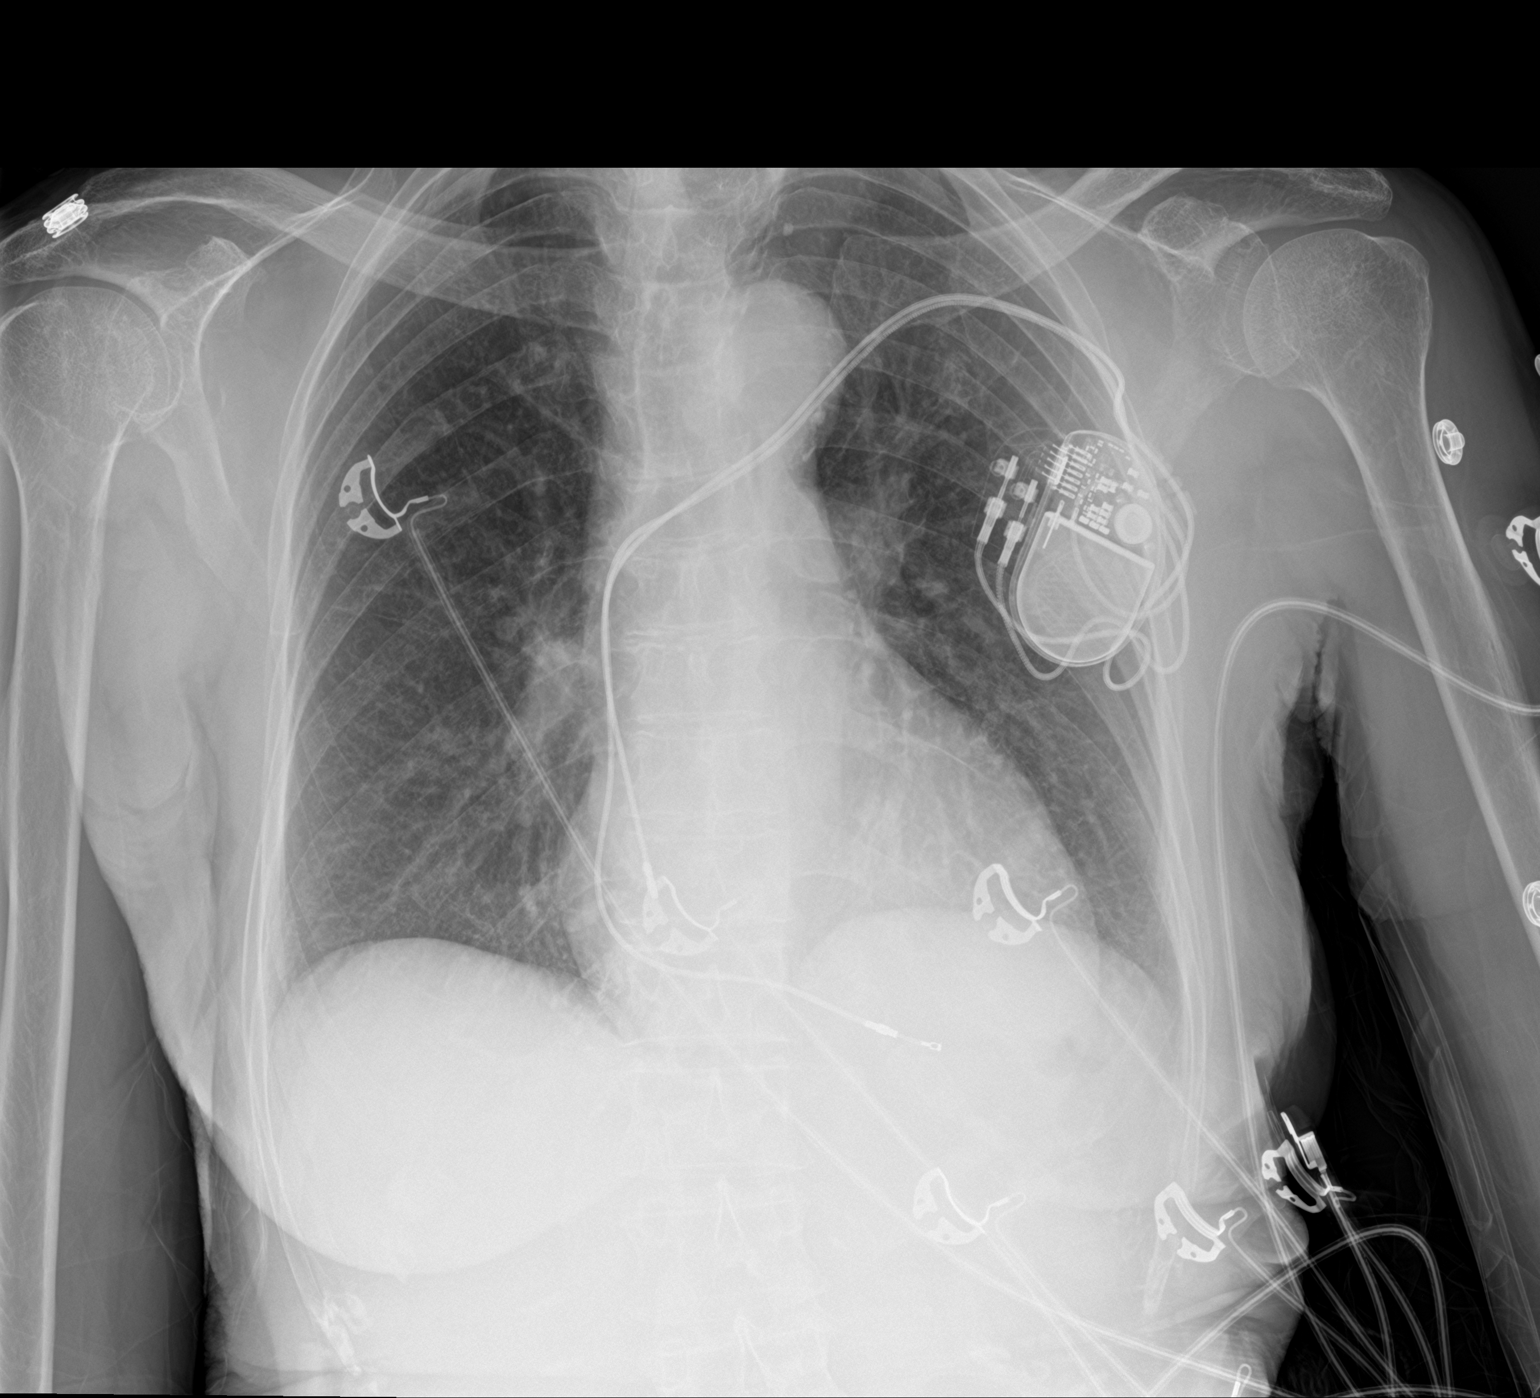

[2 of 2 positions shown; findings below may reference images not displayed]

FINDINGS: Left-sided pacing device as before. No focal opacity or pleural
effusion. Cardiomediastinal silhouette within normal limits. No
pneumothorax.
IMPRESSION: No active cardiopulmonary disease.

## 2023-11-10 ENCOUNTER — Other Ambulatory Visit: Payer: Self-pay

## 2023-11-10 ENCOUNTER — Other Ambulatory Visit (HOSPITAL_COMMUNITY): Payer: Self-pay

## 2023-12-16 ENCOUNTER — Ambulatory Visit: Payer: Medicare HMO | Admitting: Cardiovascular Disease

## 2023-12-22 NOTE — Progress Notes (Signed)
 Remote PPM Transmission

## 2023-12-23 ENCOUNTER — Ambulatory Visit: Payer: Self-pay

## 2023-12-23 DIAGNOSIS — I495 Sick sinus syndrome: Secondary | ICD-10-CM

## 2023-12-23 LAB — CUP PACEART REMOTE DEVICE CHECK
Battery Remaining Longevity: 92 mo
Battery Remaining Percentage: 78 %
Battery Voltage: 3.02 V
Brady Statistic AP VP Percent: 1 %
Brady Statistic AP VS Percent: 2 %
Brady Statistic AS VP Percent: 1 %
Brady Statistic AS VS Percent: 98 %
Brady Statistic RA Percent Paced: 1.8 %
Brady Statistic RV Percent Paced: 1 %
Date Time Interrogation Session: 20251010020019
Implantable Lead Connection Status: 753985
Implantable Lead Connection Status: 753985
Implantable Lead Implant Date: 20110128
Implantable Lead Implant Date: 20110128
Implantable Lead Location: 753859
Implantable Lead Location: 753860
Implantable Lead Model: 1948
Implantable Pulse Generator Implant Date: 20221014
Lead Channel Impedance Value: 340 Ohm
Lead Channel Impedance Value: 560 Ohm
Lead Channel Pacing Threshold Amplitude: 0.5 V
Lead Channel Pacing Threshold Amplitude: 0.5 V
Lead Channel Pacing Threshold Pulse Width: 0.4 ms
Lead Channel Pacing Threshold Pulse Width: 0.6 ms
Lead Channel Sensing Intrinsic Amplitude: 2.9 mV
Lead Channel Sensing Intrinsic Amplitude: 6.5 mV
Lead Channel Setting Pacing Amplitude: 2 V
Lead Channel Setting Pacing Amplitude: 2.5 V
Lead Channel Setting Pacing Pulse Width: 0.4 ms
Lead Channel Setting Sensing Sensitivity: 2 mV
Pulse Gen Model: 2272
Pulse Gen Serial Number: 3966374

## 2023-12-26 NOTE — Progress Notes (Signed)
 Remote PPM Transmission

## 2023-12-29 ENCOUNTER — Ambulatory Visit: Payer: Self-pay | Admitting: Cardiovascular Disease

## 2024-01-20 ENCOUNTER — Encounter: Payer: Self-pay | Admitting: Cardiovascular Disease

## 2024-01-20 ENCOUNTER — Ambulatory Visit: Attending: Cardiovascular Disease | Admitting: Cardiovascular Disease

## 2024-01-20 VITALS — BP 152/90 | HR 90 | Ht 59.0 in | Wt 105.0 lb

## 2024-01-20 DIAGNOSIS — I10 Essential (primary) hypertension: Secondary | ICD-10-CM | POA: Diagnosis not present

## 2024-01-20 DIAGNOSIS — I495 Sick sinus syndrome: Secondary | ICD-10-CM | POA: Diagnosis not present

## 2024-01-20 NOTE — Patient Instructions (Addendum)
Medication Instructions:  Your physician recommends that you continue on your current medications as directed. Please refer to the Current Medication list given to you today.  Labwork: none  Testing/Procedures: none  Follow-Up: Your physician recommends that you schedule a follow-up appointment in: 2 years. You will receive a reminder call in about 20 months reminding you to schedule your appointment. If you don't receive this call, please contact our office.  Any Other Special Instructions Will Be Listed Below (If Applicable).  If you need a refill on your cardiac medications before your next appointment, please call your pharmacy.

## 2024-01-20 NOTE — Progress Notes (Signed)
    PCP: Toribio Jerel MATSU, MD   Primary EP:  Eulas FORBES Furbish, MD  Rhonda Hill is a 83 y.o. female who presents today for routine electrophysiology followup.    She underwent generator change in October 2022.  Today, she denies symptoms of palpitations, chest pain, shortness of breath,  lower extremity edema, dizziness, presyncope, or syncope.  The patient is otherwise without complaint today.      Physical Exam: Vitals:   01/20/24 0913 01/20/24 0921  BP: (!) 150/98 (!) 152/90  Pulse: 96 90  SpO2: 100%   Weight: 105 lb (47.6 kg)   Height: 4' 11 (1.499 m)     Gen: Appears comfortable, well-nourished CV: RRR, no dependent edema The device site is normal -- no tenderness, edema, drainage, redness, threatened erosion.  Pulm: breathing easily   Pacemaker interrogation- reviewed in detail today,  See PACEART report   Assessment and Plan:  Symptomatic sinus bradycardia  Normal pacemaker function See Pace Art report No changes today she is not device dependant today   SVT Well controlled Has declined ablation No episodes recently  Return in a year  Eulas FORBES Furbish, MD  01/20/2024 9:46 AM

## 2024-01-21 LAB — CUP PACEART INCLINIC DEVICE CHECK
Brady Statistic RA Percent Paced: 1.8 %
Brady Statistic RV Percent Paced: 1 % — CL
Date Time Interrogation Session: 20251107203105
Implantable Lead Connection Status: 753985
Implantable Lead Connection Status: 753985
Implantable Lead Implant Date: 20110128
Implantable Lead Implant Date: 20110128
Implantable Lead Location: 753859
Implantable Lead Location: 753860
Implantable Lead Model: 1948
Implantable Pulse Generator Implant Date: 20221014
Pulse Gen Model: 2272
Pulse Gen Serial Number: 3966374

## 2024-03-23 ENCOUNTER — Ambulatory Visit

## 2024-03-23 DIAGNOSIS — I495 Sick sinus syndrome: Secondary | ICD-10-CM | POA: Diagnosis not present

## 2024-03-26 LAB — CUP PACEART REMOTE DEVICE CHECK
Battery Remaining Longevity: 89 mo
Battery Remaining Percentage: 76 %
Battery Voltage: 3.01 V
Brady Statistic AP VP Percent: 1 %
Brady Statistic AP VS Percent: 2.4 %
Brady Statistic AS VP Percent: 1 %
Brady Statistic AS VS Percent: 94 %
Brady Statistic RA Percent Paced: 2.2 %
Brady Statistic RV Percent Paced: 1 %
Date Time Interrogation Session: 20260109020015
Implantable Lead Connection Status: 753985
Implantable Lead Connection Status: 753985
Implantable Lead Implant Date: 20110128
Implantable Lead Implant Date: 20110128
Implantable Lead Location: 753859
Implantable Lead Location: 753860
Implantable Lead Model: 1948
Implantable Pulse Generator Implant Date: 20221014
Lead Channel Impedance Value: 310 Ohm
Lead Channel Impedance Value: 510 Ohm
Lead Channel Pacing Threshold Amplitude: 0.5 V
Lead Channel Pacing Threshold Amplitude: 0.75 V
Lead Channel Pacing Threshold Pulse Width: 0.4 ms
Lead Channel Pacing Threshold Pulse Width: 0.6 ms
Lead Channel Sensing Intrinsic Amplitude: 3 mV
Lead Channel Sensing Intrinsic Amplitude: 5.8 mV
Lead Channel Setting Pacing Amplitude: 2 V
Lead Channel Setting Pacing Amplitude: 2.5 V
Lead Channel Setting Pacing Pulse Width: 0.4 ms
Lead Channel Setting Sensing Sensitivity: 2 mV
Pulse Gen Model: 2272
Pulse Gen Serial Number: 3966374

## 2024-03-27 NOTE — Progress Notes (Signed)
 Remote PPM Transmission

## 2024-03-30 ENCOUNTER — Ambulatory Visit: Payer: Self-pay | Admitting: Cardiovascular Disease

## 2024-06-22 ENCOUNTER — Encounter

## 2024-09-21 ENCOUNTER — Encounter

## 2024-12-21 ENCOUNTER — Encounter

## 2025-03-22 ENCOUNTER — Encounter

## 2025-06-21 ENCOUNTER — Encounter
